# Patient Record
Sex: Male | Born: 1980
Health system: Southern US, Community
[De-identification: ages and names within clinical notes are randomized; demographics above are authoritative.]

## PROBLEM LIST (undated history)

## (undated) DIAGNOSIS — F419 Anxiety disorder, unspecified: Secondary | ICD-10-CM

## (undated) DIAGNOSIS — J309 Allergic rhinitis, unspecified: Secondary | ICD-10-CM

## (undated) DIAGNOSIS — M1990 Unspecified osteoarthritis, unspecified site: Secondary | ICD-10-CM

## (undated) DIAGNOSIS — Z87442 Personal history of urinary calculi: Secondary | ICD-10-CM

## (undated) DIAGNOSIS — R51 Headache: Secondary | ICD-10-CM

## (undated) DIAGNOSIS — F1021 Alcohol dependence, in remission: Secondary | ICD-10-CM

## (undated) DIAGNOSIS — E669 Obesity, unspecified: Secondary | ICD-10-CM

## (undated) HISTORY — DX: Alcohol dependence, in remission: F10.21

## (undated) HISTORY — DX: Headache: R51

## (undated) HISTORY — DX: Allergic rhinitis, unspecified: J30.9

## (undated) HISTORY — PX: TONSILLECTOMY: SUR1361

## (undated) HISTORY — DX: Obesity, unspecified: E66.9

## (undated) HISTORY — DX: Personal history of urinary calculi: Z87.442

## (undated) HISTORY — DX: Anxiety disorder, unspecified: F41.9

---
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
## 2002-04-11 ENCOUNTER — Encounter: Admission: RE | Admit: 2002-04-11 | Discharge: 2002-04-11 | Payer: Self-pay | Admitting: Sports Medicine

## 2002-04-11 ENCOUNTER — Encounter: Payer: Self-pay | Admitting: Sports Medicine

## 2003-03-09 ENCOUNTER — Encounter: Admission: RE | Admit: 2003-03-09 | Discharge: 2003-03-09 | Payer: Self-pay | Admitting: Family Medicine

## 2003-04-09 ENCOUNTER — Encounter: Admission: RE | Admit: 2003-04-09 | Discharge: 2003-04-09 | Payer: Self-pay | Admitting: Family Medicine

## 2003-04-30 ENCOUNTER — Encounter: Admission: RE | Admit: 2003-04-30 | Discharge: 2003-04-30 | Payer: Self-pay | Admitting: Sports Medicine

## 2003-07-17 ENCOUNTER — Encounter: Admission: RE | Admit: 2003-07-17 | Discharge: 2003-07-17 | Payer: Self-pay | Admitting: Sports Medicine

## 2003-08-29 ENCOUNTER — Emergency Department (HOSPITAL_COMMUNITY): Admission: EM | Admit: 2003-08-29 | Discharge: 2003-08-29 | Payer: Self-pay | Admitting: *Deleted

## 2004-11-29 ENCOUNTER — Ambulatory Visit: Payer: Self-pay | Admitting: Family Medicine

## 2005-03-20 ENCOUNTER — Ambulatory Visit: Payer: Self-pay | Admitting: Family Medicine

## 2006-01-15 ENCOUNTER — Ambulatory Visit: Payer: Self-pay | Admitting: Family Medicine

## 2006-01-26 ENCOUNTER — Ambulatory Visit: Payer: Self-pay | Admitting: Family Medicine

## 2006-06-30 ENCOUNTER — Emergency Department (HOSPITAL_COMMUNITY): Admission: EM | Admit: 2006-06-30 | Discharge: 2006-07-01 | Payer: Self-pay | Admitting: Emergency Medicine

## 2006-07-04 ENCOUNTER — Encounter: Payer: Self-pay | Admitting: Family Medicine

## 2006-07-04 ENCOUNTER — Ambulatory Visit: Payer: Self-pay | Admitting: Sports Medicine

## 2006-07-04 DIAGNOSIS — N200 Calculus of kidney: Secondary | ICD-10-CM

## 2006-07-10 ENCOUNTER — Encounter: Payer: Self-pay | Admitting: Family Medicine

## 2006-07-10 ENCOUNTER — Telehealth: Payer: Self-pay | Admitting: Family Medicine

## 2006-07-10 DIAGNOSIS — Z87442 Personal history of urinary calculi: Secondary | ICD-10-CM | POA: Insufficient documentation

## 2006-07-10 HISTORY — DX: Personal history of urinary calculi: Z87.442

## 2010-08-16 ENCOUNTER — Observation Stay (HOSPITAL_COMMUNITY)
Admission: EM | Admit: 2010-08-16 | Discharge: 2010-08-17 | Disposition: A | Discharge status: Home / Self Care | Payer: Self-pay | Attending: Emergency Medicine | Admitting: Emergency Medicine

## 2010-08-16 DIAGNOSIS — M7989 Other specified soft tissue disorders: Secondary | ICD-10-CM | POA: Insufficient documentation

## 2010-08-16 DIAGNOSIS — L03119 Cellulitis of unspecified part of limb: Secondary | ICD-10-CM | POA: Insufficient documentation

## 2010-08-16 DIAGNOSIS — L02519 Cutaneous abscess of unspecified hand: Principal | ICD-10-CM | POA: Insufficient documentation

## 2010-08-16 LAB — POCT I-STAT, CHEM 8
Calcium, Ion: 1.1 mmol/L — ABNORMAL LOW (ref 1.12–1.32)
Chloride: 105 mEq/L (ref 96–112)
Creatinine, Ser: 0.9 mg/dL (ref 0.50–1.35)
Glucose, Bld: 92 mg/dL (ref 70–99)
HCT: 41 % (ref 39.0–52.0)
Potassium: 3.2 mEq/L — ABNORMAL LOW (ref 3.5–5.1)
Sodium: 143 mEq/L (ref 135–145)
TCO2: 26 mmol/L (ref 0–100)

## 2010-08-16 LAB — CBC
Platelets: 247 10*3/uL (ref 150–400)
RDW: 13.7 % (ref 11.5–15.5)
WBC: 10.9 10*3/uL — ABNORMAL HIGH (ref 4.0–10.5)

## 2010-08-16 LAB — DIFFERENTIAL
Basophils Absolute: 0.1 10*3/uL (ref 0.0–0.1)
Eosinophils Absolute: 0.3 10*3/uL (ref 0.0–0.7)
Eosinophils Relative: 2 % (ref 0–5)
Lymphocytes Relative: 28 % (ref 12–46)
Lymphs Abs: 3.1 10*3/uL (ref 0.7–4.0)
Monocytes Absolute: 0.8 10*3/uL (ref 0.1–1.0)
Neutrophils Relative %: 61 % (ref 43–77)

## 2012-01-29 ENCOUNTER — Ambulatory Visit (INDEPENDENT_AMBULATORY_CARE_PROVIDER_SITE_OTHER): Payer: Self-pay | Admitting: Family Medicine

## 2012-01-29 ENCOUNTER — Encounter: Payer: Self-pay | Admitting: Family Medicine

## 2012-01-29 VITALS — BP 131/85 | HR 85 | Temp 98.6°F | Ht 70.5 in | Wt 254.0 lb

## 2012-01-29 DIAGNOSIS — R51 Headache: Secondary | ICD-10-CM

## 2012-01-29 DIAGNOSIS — F419 Anxiety disorder, unspecified: Secondary | ICD-10-CM

## 2012-01-29 DIAGNOSIS — R197 Diarrhea, unspecified: Secondary | ICD-10-CM

## 2012-01-29 DIAGNOSIS — F411 Generalized anxiety disorder: Secondary | ICD-10-CM

## 2012-01-29 HISTORY — DX: Anxiety disorder, unspecified: F41.9

## 2012-01-29 LAB — COMPREHENSIVE METABOLIC PANEL
AST: 20 U/L (ref 0–37)
Albumin: 4.2 g/dL (ref 3.5–5.2)
Alkaline Phosphatase: 102 U/L (ref 39–117)
Potassium: 3.8 mEq/L (ref 3.5–5.3)
Sodium: 141 mEq/L (ref 135–145)
Total Protein: 7.6 g/dL (ref 6.0–8.3)

## 2012-01-29 LAB — CBC WITH DIFFERENTIAL/PLATELET
Basophils Relative: 1 % (ref 0–1)
Eosinophils Absolute: 0.1 10*3/uL (ref 0.0–0.7)
MCH: 26.7 pg (ref 26.0–34.0)
MCHC: 34 g/dL (ref 30.0–36.0)
Monocytes Relative: 8 % (ref 3–12)
Neutrophils Relative %: 46 % (ref 43–77)
Platelets: 271 10*3/uL (ref 150–400)

## 2012-01-29 LAB — TSH: TSH: 1.719 u[IU]/mL (ref 0.350–4.500)

## 2012-01-29 LAB — POCT SEDIMENTATION RATE: POCT SED RATE: 39 mm/hr — AB (ref 0–22)

## 2012-01-29 NOTE — Patient Instructions (Signed)
Please Call Dr Spero Geralds , Psy. D., to set up a consultation.  This Consultation will help [place] develop a plan to help you with your unpleasant experiences.   You will receive the results of your lab results within 2 weeks. If you do not receive results; please call.

## 2012-01-30 ENCOUNTER — Encounter: Payer: Self-pay | Admitting: Family Medicine

## 2012-01-30 DIAGNOSIS — R51 Headache: Secondary | ICD-10-CM | POA: Insufficient documentation

## 2012-01-30 DIAGNOSIS — R519 Headache, unspecified: Secondary | ICD-10-CM | POA: Insufficient documentation

## 2012-01-30 HISTORY — DX: Headache: R51

## 2012-01-30 NOTE — Assessment & Plan Note (Addendum)
Will explore more on next office visits. CMP     Component Value Date/Time   NA 141 01/29/2012 1537   K 3.8 01/29/2012 1537   CL 105 01/29/2012 1537   CO2 24 01/29/2012 1537   GLUCOSE 82 01/29/2012 1537   BUN 12 01/29/2012 1537   CREATININE 1.06 01/29/2012 1537   CREATININE 0.90 08/16/2010 2255   CALCIUM 9.0 01/29/2012 1537   PROT 7.6 01/29/2012 1537   ALBUMIN 4.2 01/29/2012 1537   AST 20 01/29/2012 1537   ALT 22 01/29/2012 1537   ALKPHOS 102 01/29/2012 1537   BILITOT 0.2* 01/29/2012 1537   Lab Results  Component Value Date   POCTSEDRATE 39* 01/29/2012    Elevated ESR

## 2012-01-30 NOTE — Assessment & Plan Note (Signed)
Pt is presenting with Mood disorder symptoms.   He acknowledges depressive symptoms of prolonged sadness and decrease pleasure in life.    May have neuro-vegative (decreased sleep and weight gain) and somatic component (headache and diarrhea).   Also with Anxiety complaints resemble panic attacks.  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  Not yet explored impact on his daily function.   Pt has two first-degree relatives with Bipolar Disorder, so I am hesitant to start pt on SSRI immediately. Pt is willing to contact and consult with Dr Pascal Lux for assistance with diagnosis and treatment. Pt to return in 2 weeks for more in depth evaluation of Mood Disorder, with MDS, PHQ9, and Prime MD.

## 2012-01-30 NOTE — Progress Notes (Signed)
Subjective:    Patient ID: Benjamin Stone, male    DOB: 1980-04-27, 31 y.o.   MRN: [id number]  HPI  Initial Consultation to establish medical at our practice.  Pt's dgt is patient at [facility].  Pt's mother, Benjamin Stone is also a patient of our practice and a former employee of our practice.  Pt recently moved back to [place] from [place].   Concerns include persistent loose bowel movements, 4 to 5 times a day, of a "loose mud consistency".  Onset was after he returned from [employer] tour of duty in [place] in 2004.  Chest pains that are brief, lasting seconds), sharp, that are severe enough to make him want to sit down. Began a couple months ago. No associated symptoms such as N/V, shortness of breath, palpitations.   Headaches that are almost daily, can wake him up, not relieved by acetaminophen or Aleve. Last often 2 days at a time.    Sleeping only 4 hours a night, deneis grogginess or fatigue during day.  Chief complaint listed on EMR for visit was "PTSD".   Pt does not have a formal diagnosis for PTSD.  He is filing for disability compensation from the [employer].   Saddness with irritability that disturbes him.  He relates getting upset easily with others, including his young daughter.  He separates himself from situations when he becomes upset.  He reports feeling like he can't get comfortable, feeling "nervous" and "sweaty" in large social or confined spaces.  He reports feeling "on edge" constantly.  He has episodes of anxiety come over him both in social or confined situations, but can come over him while "just watching TV."  He reports vivid dreams of events from [place] a couple times a week.   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  His best friend died there.  He drank heavily in [place].  He was able to procure alcohol in [place] for himself and fellow soldiers through [employer].  He drank heavily when returning to the [place] for couple  years, quitting several years ago after the birth of his dgt.  He denies legal consequences either in [employer] or civilian life as the results of his alcohol use.  He has episodes where he spends much more money than he intended with an intended $50 purchase changing to a $500 one by the end of the shopping event.  Denies high-risk sexual or potentially physically harmful behaviors.   He reports difficulty with "time dilation" where he will have difficulty remembering when events have happened to him.  Used to enjoy working out in gymnasium, but though he has enrolled in [employer], he very rarely goes.   He denies thoughts of self-harm.   Family history significant with Bipolar Disorder in both his mother and sister.     Review of Systems  Constitutional: Negative for fever and unexpected weight change.  HENT: Positive for hearing loss. Negative for sore throat, mouth sores, trouble swallowing and tinnitus.   Eyes: Negative for visual disturbance.  Respiratory: Negative for cough and shortness of breath.   Cardiovascular: Negative for chest pain, palpitations and leg swelling.  Gastrointestinal: Positive for diarrhea. Negative for nausea, vomiting, abdominal pain, constipation and blood in stool.  Genitourinary: Negative for dysuria, frequency, hematuria, discharge, enuresis and difficulty urinating.  Musculoskeletal: Negative for joint swelling and arthralgias.  Neurological: Negative for dizziness, syncope, weakness and headaches.  Psychiatric/Behavioral: Positive for dysphoric mood and agitation. Negative for  suicidal ideas. The patient is nervous/anxious.        Objective:   Physical Exam  Vitals reviewed. Cardiovascular: Normal rate and regular rhythm.   Pulmonary/Chest: Effort normal and breath sounds normal.  Psychiatric: Thought content normal. His speech is not rapid and/or pressured, not tangential and not slurred. He is not agitated, is not hyperactive and not slowed.  Cognition and memory are normal. He does not express impulsivity or inappropriate judgment. He expresses no suicidal ideation. He expresses no suicidal plans. He exhibits normal recent memory and normal remote memory.       Groomed, good eye contact. Brought 31 year old dgt to visit. Dgt stayed with Nurses for interview.   Not anxious appearing during interview.  He is attentive.          Assessment & Plan:

## 2012-02-09 ENCOUNTER — Telehealth: Payer: Self-pay | Admitting: Psychology

## 2012-02-09 NOTE — Telephone Encounter (Signed)
Patient called to schedule an appointment.  Reviewed Dr. McDiarmid's note.  It looks like a referral to [facility].  First available was February 5th at 9:00.  He does not have health insurance so could not look elsewhere to get him in sooner unless we sent to [facility].  Thought it best to have him come here.  Encouraged him to follow-up with Dr. McDiarmid in the meantime.

## 2012-03-13 ENCOUNTER — Ambulatory Visit: Payer: Self-pay | Admitting: Psychology

## 2012-03-13 NOTE — Telephone Encounter (Signed)
Did not show up for initial [facility] appointment.  Per our policy, will not be able to schedule him in [facility].  He can be referred to [facility] if he is interested in treatment.

## 2013-02-06 HISTORY — PX: TOOTH EXTRACTION: SUR596

## 2014-03-17 ENCOUNTER — Emergency Department (HOSPITAL_BASED_OUTPATIENT_CLINIC_OR_DEPARTMENT_OTHER)
Admission: EM | Admit: 2014-03-17 | Discharge: 2014-03-17 | Disposition: A | Discharge status: Home / Self Care | Payer: PRIVATE HEALTH INSURANCE | Attending: Emergency Medicine | Admitting: Emergency Medicine

## 2014-03-17 ENCOUNTER — Encounter (HOSPITAL_BASED_OUTPATIENT_CLINIC_OR_DEPARTMENT_OTHER): Payer: Self-pay

## 2014-03-17 DIAGNOSIS — K0381 Cracked tooth: Secondary | ICD-10-CM | POA: Insufficient documentation

## 2014-03-17 DIAGNOSIS — K088 Other specified disorders of teeth and supporting structures: Secondary | ICD-10-CM | POA: Insufficient documentation

## 2014-03-17 DIAGNOSIS — Z791 Long term (current) use of non-steroidal anti-inflammatories (NSAID): Secondary | ICD-10-CM | POA: Insufficient documentation

## 2014-03-17 DIAGNOSIS — Z87891 Personal history of nicotine dependence: Secondary | ICD-10-CM | POA: Insufficient documentation

## 2014-03-17 DIAGNOSIS — K0889 Other specified disorders of teeth and supporting structures: Secondary | ICD-10-CM

## 2014-03-17 MED ORDER — AMOXICILLIN 500 MG PO CAPS: 500.0000 mg | ORAL_CAPSULE | Freq: Three times a day (TID) | ORAL | Status: AC

## 2014-03-17 MED ORDER — HYDROCODONE-ACETAMINOPHEN 5-325 MG PO TABS: 2.0000 | ORAL_TABLET | ORAL | Status: DC | PRN

## 2014-03-17 NOTE — ED Notes (Signed)
Right upper toothache x 2 weeks

## 2014-03-17 NOTE — ED Provider Notes (Signed)
CSN: [id number]     Arrival date & time 03/17/14  2102 History   First MD Initiated Contact with Patient 03/17/14 2120     Chief Complaint  Patient presents with  . Dental Pain     (Consider location/radiation/quality/duration/timing/severity/associated sxs/prior Treatment) Patient is a 34 y.o. male presenting with tooth pain. The history is provided by the patient. No language interpreter was used.  Dental Pain Location:  Upper Upper teeth location:  2/RU 2nd molar Quality:  Aching Severity:  Moderate Onset quality:  Gradual Duration:  2 weeks Timing:  Constant Progression:  Worsening Chronicity:  New Context: not abscess   Relieved by:  Nothing Worsened by:  Nothing tried Ineffective treatments:  None tried Associated symptoms: no facial swelling     Past Medical History  Diagnosis Date  . Allergic rhinitis   . Alcohol dependence in remission     Abstinence starting 2011   History reviewed. No pertinent past surgical history. Family History  Problem Relation Age of Onset  . Bipolar disorder Mother   . Bipolar disorder Sister   . Psoriasis Mother   . Diabetes Mellitus II Mother    History  Substance Use Topics  . Smoking status: Former Smoker    Quit date: 01/29/2005  . Smokeless tobacco: Not on file  . Alcohol Use: No    Review of Systems  HENT: Negative for facial swelling.   All other systems reviewed and are negative.     Allergies  Review of patient's allergies indicates no known allergies.  Home Medications   Prior to Admission medications   Medication Sig Start Date End Date Taking? Authorizing Provider  naproxen sodium (ANAPROX) 220 MG tablet Take 220 mg by mouth 2 (two) times daily with a meal.   Yes Historical Provider, MD  amoxicillin (AMOXIL) 500 MG capsule Take 1 capsule (500 mg total) by mouth 3 (three) times daily. 03/17/14 03/27/14  Elson AreasLeslie K Sofia, PA-C  HYDROcodone-acetaminophen (NORCO/VICODIN) 5-325 MG per tablet Take 2 tablets by mouth  every 4 (four) hours as needed. 03/17/14   Elson AreasLeslie K Sofia, PA-C   BP 139/88 mmHg  Pulse 68  Temp(Src) 98.2 F (36.8 C) (Oral)  Resp 18  Ht 5\' 10"  (1.778 m)  Wt 270 lb (122.471 kg)  BMI 38.74 kg/m2  SpO2 100% Physical Exam  Constitutional: He is oriented to person, place, and time. He appears well-developed and well-nourished.  HENT:  Head: Normocephalic.  Broken right upper 2nd molar  Eyes: EOM are normal.  Neck: Normal range of motion.  Pulmonary/Chest: Effort normal.  Abdominal: He exhibits no distension.  Musculoskeletal: Normal range of motion.  Neurological: He is alert and oriented to person, place, and time.  Psychiatric: He has a normal mood and affect.  Nursing note and vitals reviewed.   ED Course  Procedures (including critical care time) Labs Review Labs Reviewed - No data to display  Imaging Review No results found.   EKG Interpretation None      MDM   Final diagnoses:  Toothache    See your dentist as scheuled amoxicillian hydrocodone    Elson AreasLeslie K Sofia, PA-C 03/17/14 2137  Toy BakerAnthony T Allen, MD 03/20/14 [phone]

## 2014-03-17 NOTE — Discharge Instructions (Signed)

## 2014-06-09 ENCOUNTER — Encounter (HOSPITAL_BASED_OUTPATIENT_CLINIC_OR_DEPARTMENT_OTHER): Payer: Self-pay | Admitting: Emergency Medicine

## 2014-06-09 ENCOUNTER — Emergency Department (HOSPITAL_BASED_OUTPATIENT_CLINIC_OR_DEPARTMENT_OTHER)
Admission: EM | Admit: 2014-06-09 | Discharge: 2014-06-09 | Disposition: A | Discharge status: Home / Self Care | Payer: PRIVATE HEALTH INSURANCE | Attending: Emergency Medicine | Admitting: Emergency Medicine

## 2014-06-09 DIAGNOSIS — Z87891 Personal history of nicotine dependence: Secondary | ICD-10-CM | POA: Diagnosis not present

## 2014-06-09 DIAGNOSIS — K047 Periapical abscess without sinus: Secondary | ICD-10-CM | POA: Diagnosis not present

## 2014-06-09 DIAGNOSIS — K088 Other specified disorders of teeth and supporting structures: Secondary | ICD-10-CM | POA: Diagnosis present

## 2014-06-09 DIAGNOSIS — Z791 Long term (current) use of non-steroidal anti-inflammatories (NSAID): Secondary | ICD-10-CM | POA: Diagnosis not present

## 2014-06-09 MED ORDER — PENICILLIN V POTASSIUM 500 MG PO TABS: 500.0000 mg | ORAL_TABLET | Freq: Four times a day (QID) | ORAL | Status: AC

## 2014-06-09 MED ORDER — IBUPROFEN 800 MG PO TABS: 800.0000 mg | ORAL_TABLET | Freq: Three times a day (TID) | ORAL | Status: DC | PRN

## 2014-06-09 NOTE — Discharge Instructions (Signed)

## 2014-06-09 NOTE — ED Notes (Signed)
34 yo with dental pain right lower jaw. Reports using warm salt water rinse with no relief. Rates pain 8/10

## 2014-06-09 NOTE — ED Provider Notes (Signed)
CSN: [id number]     Arrival date & time 06/09/14  1004 History   First MD Initiated Contact with Patient 06/09/14 1123     Chief Complaint  Patient presents with  . Dental Pain     (Consider location/radiation/quality/duration/timing/severity/associated sxs/prior Treatment) Patient is a 34 y.o. male presenting with tooth pain.  Dental Pain Location:  Lower Lower teeth location:  19/LL 1st molar Quality:  Dull and aching Severity:  Severe Onset quality:  Gradual Duration:  4 days Timing:  Constant Progression:  Worsening Chronicity:  New Context: not dental fracture   Context comment:  Filling placed in this tooth years ago Previous work-up:  Filled cavity Relieved by: ibuprofen. Worsened by:  Pressure and touching Associated symptoms: facial pain and facial swelling   Associated symptoms: no fever, no oral lesions and no trismus     Past Medical History  Diagnosis Date  . Allergic rhinitis   . Alcohol dependence in remission     Abstinence starting 2011   Past Surgical History  Procedure Laterality Date  . Tonsillectomy    . Tooth extraction  2015    Feb 17th    Family History  Problem Relation Age of Onset  . Bipolar disorder Mother   . Bipolar disorder Sister   . Psoriasis Mother   . Diabetes Mellitus II Mother    History  Substance Use Topics  . Smoking status: Former Smoker    Quit date: 01/29/2005  . Smokeless tobacco: Not on file  . Alcohol Use: No    Review of Systems  Constitutional: Negative for fever.  HENT: Positive for facial swelling. Negative for mouth sores.   All other systems reviewed and are negative.     Allergies  Review of patient's allergies indicates no known allergies.  Home Medications   Prior to Admission medications   Medication Sig Start Date End Date Taking? Authorizing Provider  HYDROcodone-acetaminophen (NORCO/VICODIN) 5-325 MG per tablet Take 2 tablets by mouth every 4 (four) hours as needed. 03/17/14   Elson AreasLeslie K  Sofia, PA-C  naproxen sodium (ANAPROX) 220 MG tablet Take 220 mg by mouth 2 (two) times daily with a meal.    Historical Provider, MD   BP 129/85 mmHg  Pulse 70  Temp(Src) 98.2 F (36.8 C) (Oral)  Resp 18  Ht 6' (1.829 m)  Wt 262 lb (118.842 kg)  BMI 35.53 kg/m2  SpO2 100% Physical Exam  Constitutional: He is oriented to person, place, and time. He appears well-developed and well-nourished. No distress.  HENT:  Head: Normocephalic and atraumatic.  Mouth/Throat:    Eyes: Conjunctivae are normal. No scleral icterus.  Neck: Neck supple.  Cardiovascular: Normal rate and intact distal pulses.   Pulmonary/Chest: Effort normal. No stridor. No respiratory distress.  Abdominal: Normal appearance. He exhibits no distension.  Neurological: He is alert and oriented to person, place, and time.  Skin: Skin is warm and dry. No rash noted.  Psychiatric: He has a normal mood and affect. His behavior is normal.  Nursing note and vitals reviewed.   ED Course  Procedures (including critical care time) Labs Review Labs Reviewed - No data to display  Imaging Review No results found.   EKG Interpretation None      MDM   Final diagnoses:  Dental abscess    Dental pain without apparent complication.  PCN and he already has dental appointment next week.      Blake DivineJohn Alaysha Jefcoat, MD 06/09/14 [phone]

## 2015-05-03 ENCOUNTER — Encounter: Payer: Self-pay | Admitting: Internal Medicine

## 2015-05-03 ENCOUNTER — Ambulatory Visit (INDEPENDENT_AMBULATORY_CARE_PROVIDER_SITE_OTHER): Payer: PRIVATE HEALTH INSURANCE | Admitting: Internal Medicine

## 2015-05-03 VITALS — Temp 98.1°F | Wt 246.1 lb

## 2015-05-03 DIAGNOSIS — J029 Acute pharyngitis, unspecified: Secondary | ICD-10-CM | POA: Insufficient documentation

## 2015-05-03 NOTE — Progress Notes (Signed)
   Subjective:    Patient ID: Benjamin Stone, male    DOB: 1980/06/18, 35 y.o.   MRN: [id number]  HPI  Patient presents for same day appointment for sore throat.   Sore Throat Patient reports sore throat beginning a day and a half ago. He reports that initially his throat felt scratchy, and then became more painful as the night went on. He has been drinking orange juice and using cough drops with brief symptomatic relief. He denies fever, body aches, sneezing, itching or red eyes, coughing. Endorses minimal nasal congestion. Denies sick contacts. Reports that he was in [place] last week and there was a lot of pollen, so he thinks his symptoms may be due to allergies.   Review of Systems See HPI.     Objective:   Physical Exam  Constitutional: He is oriented to person, place, and time. He appears well-developed and well-nourished. No distress.  HENT:  Head: Normocephalic and atraumatic.  Mild oropharyngeal erythema. No exudates. S/p tonsillectomy. No cervical lymphadenopathy or tenderness.  Eyes: Conjunctivae and EOM are normal. Right eye exhibits no discharge. Left eye exhibits no discharge.  Neck: Neck supple.  Lymphadenopathy:    He has no cervical adenopathy.  Neurological: He is alert and oriented to person, place, and time.  Psychiatric: He has a normal mood and affect. His behavior is normal.      Assessment & Plan:  Sore throat Centor score 1 (no fever, no tonsillar exudates, no lymphadenopathy, no cough). Given low Centor score, strep swab not indicated today. Likely 2/2 seasonal allergies rather than virus, especially given reported pollen exposure in days preceding symptoms.  - Resume Claritin  - Can continue cough drops and/or honey for symptomatic relief    Tarri AbernethyAbigail J Tyrece Vanterpool, MD PGY-1 [facility] Pager [phone]

## 2015-05-03 NOTE — Assessment & Plan Note (Signed)
Centor score 1 (no fever, no tonsillar exudates, no lymphadenopathy, no cough). Given low Centor score, strep swab not indicated today. Likely 2/2 seasonal allergies rather than virus, especially given reported pollen exposure in days preceding symptoms.  - Resume Claritin  - Can continue cough drops and/or honey for symptomatic relief

## 2015-05-03 NOTE — Patient Instructions (Addendum)
It was nice meeting you today Mr. Benjamin Stone!  For your sore throat, you can continue to use the throat lozenges that you have been using. You can also have a spoonful of honey, either alone or in a warm beverage, a few times a day, especially before bed. Using your regular Claritin will likely help as well.   If you have any questions or concerns, please feel free to call the office at any time.   Be well,  Dr. Natale MilchLancaster  Sore Throat A sore throat is a painful, burning, sore, or scratchy feeling of the throat. There may be pain or tenderness when swallowing or talking. You may have other symptoms with a sore throat. These include coughing, sneezing, fever, or a swollen neck. A sore throat is often the first sign of another sickness. These sicknesses may include a cold, flu, strep throat, or an infection called mono. Most sore throats go away without medical treatment.  HOME CARE   Only take medicine as told by your doctor.  Drink enough fluids to keep your pee (urine) clear or pale yellow.  Rest as needed.  Try using throat sprays, lozenges, or suck on hard candy (if older than 4 years or as told).  Sip warm liquids, such as broth, herbal tea, or warm water with honey. Try sucking on frozen ice pops or drinking cold liquids.  Rinse the mouth (gargle) with salt water. Mix 1 teaspoon salt with 8 ounces of water.  Do not smoke. Avoid being around others when they are smoking.  Put a humidifier in your bedroom at night to moisten the air. You can also turn on a hot shower and sit in the bathroom for 5-10 minutes. Be sure the bathroom door is closed. GET HELP RIGHT AWAY IF:   You have trouble breathing.  You cannot swallow fluids, soft foods, or your spit (saliva).  You have more puffiness (swelling) in the throat.  Your sore throat does not get better in 7 days.  You feel sick to your stomach (nauseous) and throw up (vomit).  You have a fever or lasting symptoms for more than 2-3  days.  You have a fever and your symptoms suddenly get worse. MAKE SURE YOU:   Understand these instructions.  Will watch your condition.  Will get help right away if you are not doing well or get worse.   This information is not intended to replace advice given to you by your health care provider. Make sure you discuss any questions you have with your health care provider.   Document Released: 11/02/2007 Document Revised: 10/18/2011 Document Reviewed: 10/01/2011 Elsevier Interactive Patient Education [employer]2016 Elsevier Inc.

## 2015-06-03 ENCOUNTER — Encounter (HOSPITAL_BASED_OUTPATIENT_CLINIC_OR_DEPARTMENT_OTHER): Payer: Self-pay | Admitting: Emergency Medicine

## 2015-06-03 ENCOUNTER — Emergency Department (HOSPITAL_BASED_OUTPATIENT_CLINIC_OR_DEPARTMENT_OTHER)
Admission: EM | Admit: 2015-06-03 | Discharge: 2015-06-03 | Disposition: A | Discharge status: Home / Self Care | Payer: 59 | Attending: Emergency Medicine | Admitting: Emergency Medicine

## 2015-06-03 DIAGNOSIS — R1032 Left lower quadrant pain: Secondary | ICD-10-CM | POA: Insufficient documentation

## 2015-06-03 DIAGNOSIS — Z87891 Personal history of nicotine dependence: Secondary | ICD-10-CM | POA: Insufficient documentation

## 2015-06-03 LAB — COMPREHENSIVE METABOLIC PANEL
ALBUMIN: 3.8 g/dL (ref 3.5–5.0)
ALK PHOS: 100 U/L (ref 38–126)
ALT: 29 U/L (ref 17–63)
AST: 26 U/L (ref 15–41)
Anion gap: <3 — ABNORMAL LOW (ref 5–15)
BILIRUBIN TOTAL: 0.6 mg/dL (ref 0.3–1.2)
BUN: 13 mg/dL (ref 6–20)
CALCIUM: 8.6 mg/dL — AB (ref 8.9–10.3)
CO2: 28 mmol/L (ref 22–32)
CREATININE: 1.09 mg/dL (ref 0.61–1.24)
Chloride: 107 mmol/L (ref 101–111)
GFR calc Af Amer: >60 mL/min (ref >60)
GFR calc non Af Amer: >60 mL/min (ref >60)
Glucose, Bld: 102 mg/dL — ABNORMAL HIGH (ref 65–99)
Potassium: 4.1 mmol/L (ref 3.5–5.1)
Sodium: 137 mmol/L (ref 135–145)
Total Protein: 7.9 g/dL (ref 6.5–8.1)

## 2015-06-03 LAB — URINALYSIS, ROUTINE W REFLEX MICROSCOPIC
BILIRUBIN URINE: NEGATIVE
Glucose, UA: NEGATIVE mg/dL
HGB URINE DIPSTICK: NEGATIVE
Ketones, ur: NEGATIVE mg/dL
Leukocytes, UA: NEGATIVE
Nitrite: NEGATIVE
PROTEIN: NEGATIVE mg/dL
Specific Gravity, Urine: 1.023 (ref 1.005–1.030)
pH: 6 (ref 5.0–8.0)

## 2015-06-03 LAB — CBC
HCT: 38.1 % — ABNORMAL LOW (ref 39.0–52.0)
Hemoglobin: 12.6 g/dL — ABNORMAL LOW (ref 13.0–17.0)
MCH: 26.8 pg (ref 26.0–34.0)
MCHC: 33.1 g/dL (ref 30.0–36.0)
MCV: 80.9 fL (ref 78.0–100.0)
PLATELETS: 220 10*3/uL (ref 150–400)
RBC: 4.71 MIL/uL (ref 4.22–5.81)
RDW: 14.7 % (ref 11.5–15.5)
WBC: 6.6 10*3/uL (ref 4.0–10.5)

## 2015-06-03 LAB — LIPASE, BLOOD: Lipase: 19 U/L (ref 11–51)

## 2015-06-03 MED ORDER — ACETAMINOPHEN 500 MG PO TABS
1000.0000 mg | ORAL_TABLET | Freq: Once | ORAL | Status: AC
  Administered 2015-06-03: 1000 mg via ORAL
  Filled 2015-06-03: qty 2

## 2015-06-03 MED ORDER — ACETAMINOPHEN 500 MG PO TABS: 1000.0000 mg | ORAL_TABLET | Freq: Four times a day (QID) | ORAL | Status: DC | PRN

## 2015-06-03 MED FILL — MAPAP 500 MG CAPLET: 500 | 13 days supply | Qty: 100 | Fill #0

## 2015-06-03 NOTE — ED Notes (Signed)
Pt reports llq abdominal pain with sudden onset yesterday after noon after lunch, describes pain as a cramp, pt states pain decreased yesterday evening but then reoccured this am, with increased intensity of pain, no radiation, no n/v/d

## 2015-06-03 NOTE — ED Provider Notes (Signed)
CSN: [id number]     Arrival date & time 06/03/15  1022 History   First MD Initiated Contact with Patient 06/03/15 1036     Chief Complaint  Patient presents with  . Abdominal Pain   HPI   Benjamin Stone is an 35 y.o. male who presents to the ED for evaluation of abdominal pain. He states the pain started around 3 PM  Yesterday afternoon. He states the pain is in his LLQ with no radiation. He states it feels like a muscle cramp. States it is worse with movement. He states he tried hydrating with water and gatorade, took an aleve this morning with no relief. He denies N/V/D, fever, chills, urinary symptoms. He states it feels like a muscle cramp but denies new injury or trauma. He does endorse bowling twice weekly, last time was 2 nights ago, but does not remember injuring himself.   Past Medical History  Diagnosis Date  . Allergic rhinitis   . Alcohol dependence in remission (HCC)     Abstinence starting 2011   Past Surgical History  Procedure Laterality Date  . Tonsillectomy    . Tooth extraction  2015    Feb 17th    Family History  Problem Relation Age of Onset  . Bipolar disorder Mother   . Bipolar disorder Sister   . Psoriasis Mother   . Diabetes Mellitus II Mother    Social History  Substance Use Topics  . Smoking status: Former Smoker    Quit date: 01/29/2005  . Smokeless tobacco: None  . Alcohol Use: No    Review of Systems  All other systems reviewed and are negative.     Allergies  Review of patient's allergies indicates no known allergies.  Home Medications   Prior to Admission medications   Medication Sig Start Date End Date Taking? Authorizing Provider  HYDROcodone-acetaminophen (NORCO/VICODIN) 5-325 MG per tablet Take 2 tablets by mouth every 4 (four) hours as needed. 03/17/14   Elson Areas, PA-C  ibuprofen (ADVIL,MOTRIN) 800 MG tablet Take 1 tablet (800 mg total) by mouth every 8 (eight) hours as needed for moderate pain. 06/09/14   Blake Divine, MD   naproxen sodium (ANAPROX) 220 MG tablet Take 220 mg by mouth 2 (two) times daily with a meal.    Historical Provider, MD   BP 133/92 mmHg  Pulse 79  Temp(Src) 98.3 F (36.8 C) (Oral)  Resp 20  Wt 111.585 kg  SpO2 100% Physical Exam  Constitutional: He is oriented to person, place, and time.  HENT:  Right Ear: External ear normal.  Left Ear: External ear normal.  Nose: Nose normal.  Mouth/Throat: Oropharynx is clear and moist. No oropharyngeal exudate.  Eyes: Conjunctivae and EOM are normal. Pupils are equal, round, and reactive to light.  Neck: Normal range of motion. Neck supple.  Cardiovascular: Normal rate, regular rhythm, normal heart sounds and intact distal pulses.   Pulmonary/Chest: Effort normal and breath sounds normal. No respiratory distress. He has no wheezes. He exhibits no tenderness.  Abdominal: Soft. Bowel sounds are normal. He exhibits no distension. There is tenderness. There is no rebound and no guarding.  Mild diffuse LLQ tenderness but no guarding. No rebound. Abdomen soft. No CVA tenderness.   Musculoskeletal: He exhibits no edema.  Neurological: He is alert and oriented to person, place, and time. No cranial nerve deficit.  Skin: Skin is warm and dry.  Psychiatric: He has a normal mood and affect.  Nursing note and vitals reviewed.  ED Course  Procedures (including critical care time) Labs Review Labs Reviewed  COMPREHENSIVE METABOLIC PANEL - Abnormal; Notable for the following:    Glucose, Bld 102 (*)    Calcium 8.6 (*)    Anion gap <3 (*)    All other components within normal limits  CBC - Abnormal; Notable for the following:    Hemoglobin 12.6 (*)    HCT 38.1 (*)    All other components within normal limits  LIPASE, BLOOD  URINALYSIS, ROUTINE W REFLEX MICROSCOPIC (NOT AT [facility])    Imaging Review No results found. I have personally reviewed and evaluated these images and lab results as part of my medical decision-making.   EKG  Interpretation None      MDM   Final diagnoses:  Left lower quadrant pain    Pain is improved with tylenol. Labs negative and reassuring. On repeat exam abdomen remains non peritoneal and tenderness resolved. Pt is afebrile and well appearing. No indication for emergent imaging or further workup. Instructed to f/u with PCP. Tylenol rx given for pain . ER return precautions given.     Carlene CoriaSerena Y Andrya Roppolo, PA-C 06/04/15 1637  Alvira MondayErin Schlossman, MD 06/06/15 [phone]

## 2015-06-03 NOTE — Discharge Instructions (Signed)

## 2015-06-04 ENCOUNTER — Emergency Department (HOSPITAL_COMMUNITY)
Admission: EM | Admit: 2015-06-04 | Discharge: 2015-06-04 | Disposition: A | Discharge status: Home / Self Care | Payer: 59 | Attending: Emergency Medicine | Admitting: Emergency Medicine

## 2015-06-04 ENCOUNTER — Emergency Department (HOSPITAL_COMMUNITY): Payer: 59

## 2015-06-04 ENCOUNTER — Encounter (HOSPITAL_COMMUNITY): Payer: Self-pay | Admitting: *Deleted

## 2015-06-04 DIAGNOSIS — Z87891 Personal history of nicotine dependence: Secondary | ICD-10-CM | POA: Diagnosis not present

## 2015-06-04 DIAGNOSIS — Z791 Long term (current) use of non-steroidal anti-inflammatories (NSAID): Secondary | ICD-10-CM | POA: Insufficient documentation

## 2015-06-04 DIAGNOSIS — Z8709 Personal history of other diseases of the respiratory system: Secondary | ICD-10-CM | POA: Insufficient documentation

## 2015-06-04 DIAGNOSIS — R1032 Left lower quadrant pain: Secondary | ICD-10-CM | POA: Diagnosis not present

## 2015-06-04 DIAGNOSIS — R109 Unspecified abdominal pain: Secondary | ICD-10-CM | POA: Diagnosis present

## 2015-06-04 LAB — COMPREHENSIVE METABOLIC PANEL
ALT: 27 U/L (ref 17–63)
AST: 22 U/L (ref 15–41)
Albumin: 3.5 g/dL (ref 3.5–5.0)
Alkaline Phosphatase: 105 U/L (ref 38–126)
Anion gap: 10 (ref 5–15)
BUN: 11 mg/dL (ref 6–20)
CHLORIDE: 108 mmol/L (ref 101–111)
CO2: 24 mmol/L (ref 22–32)
CREATININE: 1.18 mg/dL (ref 0.61–1.24)
Calcium: 8.5 mg/dL — ABNORMAL LOW (ref 8.9–10.3)
GFR calc Af Amer: >60 mL/min (ref >60)
GFR calc non Af Amer: >60 mL/min (ref >60)
Glucose, Bld: 110 mg/dL — ABNORMAL HIGH (ref 65–99)
Potassium: 3.7 mmol/L (ref 3.5–5.1)
SODIUM: 142 mmol/L (ref 135–145)
Total Bilirubin: 0.3 mg/dL (ref 0.3–1.2)
Total Protein: 7.3 g/dL (ref 6.5–8.1)

## 2015-06-04 LAB — CBC WITH DIFFERENTIAL/PLATELET
BASOS ABS: 0.1 10*3/uL (ref 0.0–0.1)
Basophils Relative: 1 %
EOS ABS: 0.1 10*3/uL (ref 0.0–0.7)
EOS PCT: 1 %
HCT: 37.7 % — ABNORMAL LOW (ref 39.0–52.0)
Hemoglobin: 11.9 g/dL — ABNORMAL LOW (ref 13.0–17.0)
Lymphocytes Relative: 32 %
Lymphs Abs: 2.3 10*3/uL (ref 0.7–4.0)
MCH: 25.9 pg — ABNORMAL LOW (ref 26.0–34.0)
MCHC: 31.6 g/dL (ref 30.0–36.0)
MCV: 82 fL (ref 78.0–100.0)
Monocytes Absolute: 0.7 10*3/uL (ref 0.1–1.0)
Monocytes Relative: 10 %
Neutro Abs: 4 10*3/uL (ref 1.7–7.7)
Neutrophils Relative %: 56 %
PLATELETS: 218 10*3/uL (ref 150–400)
RBC: 4.6 MIL/uL (ref 4.22–5.81)
RDW: 14.8 % (ref 11.5–15.5)
WBC: 7.1 10*3/uL (ref 4.0–10.5)

## 2015-06-04 LAB — URINALYSIS, ROUTINE W REFLEX MICROSCOPIC
Bilirubin Urine: NEGATIVE
Glucose, UA: NEGATIVE mg/dL
HGB URINE DIPSTICK: NEGATIVE
Ketones, ur: NEGATIVE mg/dL
Leukocytes, UA: NEGATIVE
Nitrite: NEGATIVE
Protein, ur: NEGATIVE mg/dL
SPECIFIC GRAVITY, URINE: 1.022 (ref 1.005–1.030)
pH: 6 (ref 5.0–8.0)

## 2015-06-04 LAB — LIPASE, BLOOD: Lipase: 22 U/L (ref 11–51)

## 2015-06-04 MED ORDER — OXYCODONE-ACETAMINOPHEN 5-325 MG PO TABS: 1.0000 | ORAL_TABLET | ORAL | Status: DC | PRN

## 2015-06-04 MED ORDER — IOPAMIDOL (ISOVUE-300) INJECTION 61%
INTRAVENOUS | Status: AC
  Administered 2015-06-04: 100 mL via INTRAVENOUS
  Filled 2015-06-04: qty 100

## 2015-06-04 MED ORDER — OXYCODONE-ACETAMINOPHEN 5-325 MG PO TABS
ORAL_TABLET | ORAL | Status: AC
  Filled 2015-06-04: qty 1

## 2015-06-04 MED ORDER — ONDANSETRON 4 MG PO TBDP: 4.0000 mg | ORAL_TABLET | Freq: Three times a day (TID) | ORAL | Status: DC | PRN

## 2015-06-04 MED ORDER — MORPHINE SULFATE (PF) 4 MG/ML IV SOLN
4.0000 mg | Freq: Once | INTRAVENOUS | Status: AC
  Administered 2015-06-04: 4 mg via INTRAVENOUS
  Filled 2015-06-04: qty 1

## 2015-06-04 MED ORDER — OXYCODONE-ACETAMINOPHEN 5-325 MG PO TABS
1.0000 | ORAL_TABLET | ORAL | Status: DC | PRN
  Administered 2015-06-04: 1 via ORAL

## 2015-06-04 MED FILL — ONDANSETRON ODT 4 MG TABLET: 4 | 4 days supply | Qty: 10 | Fill #0

## 2015-06-04 MED FILL — OXYCODONE/APAP 5-325: 5-325 | 3 days supply | Qty: 15 | Fill #0

## 2015-06-04 NOTE — Discharge Instructions (Signed)
Take the prescribed medication as directed.  Use caution when taking percocet, it can make you sleepy.  Do not take excess tylenol with this medication, may take motrin with it instead. Follow-up with your primary care physician on Monday if you continue having symptoms through the weekend. Return to the ED for new or worsening symptoms.

## 2015-06-04 NOTE — ED Notes (Signed)
Pt to ED c/o LLQ pain onset 2 days. Was seen at [facility] yesterday, given prescription for Tylenol. Pain is more consistent and has worsened since discharge. Denies GI/GU issues

## 2015-06-04 NOTE — ED Provider Notes (Signed)
CSN: [id number]     Arrival date & time 06/04/15  0423 History   First MD Initiated Contact with Patient 06/04/15 [phone]     Chief Complaint  Patient presents with  . Abdominal Pain     (Consider location/radiation/quality/duration/timing/severity/associated sxs/prior Treatment) Patient is a 35 y.o. male presenting with abdominal pain. The history is provided by the patient and medical records.  Abdominal Pain   35 y.o. M with hx of alcohol abuse, presenting to the ED for abdominal pain.  Patient reports abdominal pain began on yesterday afternoon while he was at work but pain has worsened since this time.  He states he did take some Motrin which helped somewhat. He was [facility] yesterday afternoon when pain returned.  There he had negative labs and was discharged home with tylenol.  Patient states throughout the night pain has worsened. Her pain continues to be crampy in nature, but now has intermittent sharp, stabbing sensations. No radiation of pain. He denies any nausea, vomiting, or diarrhea. No urinary symptoms. No history of GI issues. No history of kidney stones. No prior abdominal surgeries. Vital signs stable on arrival.  Past Medical History  Diagnosis Date  . Allergic rhinitis   . Alcohol dependence in remission (HCC)     Abstinence starting 2011   Past Surgical History  Procedure Laterality Date  . Tonsillectomy    . Tooth extraction  2015    Feb 17th    Family History  Problem Relation Age of Onset  . Bipolar disorder Mother   . Bipolar disorder Sister   . Psoriasis Mother   . Diabetes Mellitus II Mother    Social History  Substance Use Topics  . Smoking status: Former Smoker    Quit date: 01/29/2005  . Smokeless tobacco: None  . Alcohol Use: No    Review of Systems  Gastrointestinal: Positive for abdominal pain.  All other systems reviewed and are negative.     Allergies  Review of patient's allergies indicates no known  allergies.  Home Medications   Prior to Admission medications   Medication Sig Start Date End Date Taking? Authorizing Provider  acetaminophen (TYLENOL) 500 MG tablet Take 2 tablets (1,000 mg total) by mouth every 6 (six) hours as needed. 06/03/15   Carlene CoriaSerena Y Sam, PA-C  HYDROcodone-acetaminophen (NORCO/VICODIN) 5-325 MG per tablet Take 2 tablets by mouth every 4 (four) hours as needed. 03/17/14   Elson AreasLeslie K Sofia, PA-C  ibuprofen (ADVIL,MOTRIN) 800 MG tablet Take 1 tablet (800 mg total) by mouth every 8 (eight) hours as needed for moderate pain. 06/09/14   Blake DivineJohn Wofford, MD  naproxen sodium (ANAPROX) 220 MG tablet Take 220 mg by mouth 2 (two) times daily with a meal.    Historical Provider, MD   BP 136/86 mmHg  Pulse 87  Temp(Src) 97.6 F (36.4 C) (Oral)  Resp 18  Wt 111.585 kg  SpO2 97%   Physical Exam  Constitutional: He is oriented to person, place, and time. He appears well-developed and well-nourished. No distress.  HENT:  Head: Normocephalic and atraumatic.  Mouth/Throat: Oropharynx is clear and moist.  Eyes: Conjunctivae and EOM are normal. Pupils are equal, round, and reactive to light.  Neck: Normal range of motion.  Cardiovascular: Normal rate, regular rhythm and normal heart sounds.   Pulmonary/Chest: Effort normal and breath sounds normal. No respiratory distress. He has no wheezes.  Abdominal: Soft. Bowel sounds are normal. There is tenderness in the left lower quadrant. There is  no guarding and no CVA tenderness.    Tenderness in left lower quadrant, no rebound or guarding, no CVA tenderness  Musculoskeletal: Normal range of motion.  Neurological: He is alert and oriented to person, place, and time.  Skin: Skin is warm and dry. He is not diaphoretic.  Psychiatric: He has a normal mood and affect.  Nursing note and vitals reviewed.   ED Course  Procedures (including critical care time) Labs Review Labs Reviewed  CBC WITH DIFFERENTIAL/PLATELET - Abnormal; Notable for the  following:    Hemoglobin 11.9 (*)    HCT 37.7 (*)    MCH 25.9 (*)    All other components within normal limits  COMPREHENSIVE METABOLIC PANEL - Abnormal; Notable for the following:    Glucose, Bld 110 (*)    Calcium 8.5 (*)    All other components within normal limits  LIPASE, BLOOD  URINALYSIS, ROUTINE W REFLEX MICROSCOPIC (NOT AT [facility])    Imaging Review Ct Abdomen Pelvis W Contrast  06/04/2015  CLINICAL DATA:  Left lower quadrant pain for 3 days. EXAM: CT ABDOMEN AND PELVIS WITH CONTRAST TECHNIQUE: Multidetector CT imaging of the abdomen and pelvis was performed using the standard protocol following bolus administration of intravenous contrast. CONTRAST:  1 ISOVUE-300 IOPAMIDOL (ISOVUE-300) INJECTION 61% COMPARISON:  CT scan dated 06/30/2006 FINDINGS: Lower chest:  Normal. Hepatobiliary: Normal. Pancreas: Normal. Spleen: Normal. Adrenals/Urinary Tract: Normal. Stomach/Bowel: Normal. Vascular/Lymphatic: Normal. Reproductive: Normal. Other: No free air or free fluid. Musculoskeletal: Small broad-based disc bulge at L5-S1 asymmetric to the right. Otherwise normal. IMPRESSION: Benign appearing abdomen and pelvis. Electronically Signed   By: Francene Boyers M.D.   On: 06/04/2015 09:23   I have personally reviewed and evaluated these images and lab results as part of my medical decision-making.   EKG Interpretation None      MDM   Final diagnoses:  Left lower quadrant pain   35 year old male here with left lower abdominal pain for the past 2 days. Seen yesterday for the same but reports worsening symptoms. Patient is afebrile, nontoxic. He is tenderness in the left lower quadrant without rebound or guarding. No peritoneal signs. Labwork today again appears reassuring. CT scan was obtained which is negative for acute findings. Patient has remained without any nausea, vomiting, or diarrhea here in the ED. He appears stable for discharge. Encouraged follow-up with his PCP on Monday if symptoms  persist. Rx percocet, zofran.  Discussed plan with patient, he/she acknowledged understanding and agreed with plan of care.  Return precautions given for new or worsening symptoms.  Garlon Hatchet, PA-C 06/04/15 1110  April Palumbo, MD 06/06/15 2259

## 2015-06-09 ENCOUNTER — Encounter: Payer: Self-pay | Admitting: Family Medicine

## 2015-06-09 ENCOUNTER — Ambulatory Visit (INDEPENDENT_AMBULATORY_CARE_PROVIDER_SITE_OTHER): Payer: 59 | Admitting: Family Medicine

## 2015-06-09 VITALS — BP 127/79 | HR 77 | Temp 98.7°F | Ht 72.0 in | Wt 250.0 lb

## 2015-06-09 DIAGNOSIS — R1032 Left lower quadrant pain: Secondary | ICD-10-CM | POA: Diagnosis not present

## 2015-06-09 NOTE — Progress Notes (Signed)
   Subjective:    Patient ID: Benjamin Stone, male    DOB: 1981-01-16, 35 y.o.   MRN: [id number]  HPI  Abdomen Pain Er follow up.  Started about 5 days ago focal LLQ pain that waxwed and waned then became severe and he presented to ER on 4/28.  Dorna BloomWu was negative.  Pain has gradually improved since then.  No nausea and vomiting or fever or bleeding.  Took only one percocet 3 days ago.    Reviewed ER labs and CT personally   Chief Complaint noted Review of Symptoms - see HPI PMH - Smoking status noted.   Vital Signs reviewed    Review of Systems     Objective:   Physical Exam   Alert nad Abdomen: soft and with very mild LLQ focal tenderness, without masses, organomegaly or hernias noted.  No guarding or rebound No CVAT  Skin:  Intact without suspicious lesions or rashes      Assessment & Plan:    Abdomen Pain  - no clear etiology.  No red flags.  Continue to monitor

## 2015-06-09 NOTE — Patient Instructions (Signed)
Good to see you today!  Thanks for coming in.   Abdominal Pain  Call us or go to the ER if you have severe pain or bleeding or high fever or pain with urination

## 2016-03-09 ENCOUNTER — Ambulatory Visit (INDEPENDENT_AMBULATORY_CARE_PROVIDER_SITE_OTHER): Payer: 59 | Admitting: Family Medicine

## 2016-03-09 ENCOUNTER — Encounter: Payer: Self-pay | Admitting: Family Medicine

## 2016-03-09 VITALS — BP 122/74 | HR 82 | Temp 98.2°F | Ht 72.0 in | Wt 256.0 lb

## 2016-03-09 DIAGNOSIS — E7800 Pure hypercholesterolemia, unspecified: Secondary | ICD-10-CM | POA: Diagnosis not present

## 2016-03-09 DIAGNOSIS — Z79899 Other long term (current) drug therapy: Secondary | ICD-10-CM | POA: Diagnosis not present

## 2016-03-09 DIAGNOSIS — Z13220 Encounter for screening for lipoid disorders: Secondary | ICD-10-CM | POA: Diagnosis not present

## 2016-03-09 DIAGNOSIS — M25579 Pain in unspecified ankle and joints of unspecified foot: Secondary | ICD-10-CM | POA: Diagnosis not present

## 2016-03-09 DIAGNOSIS — E669 Obesity, unspecified: Secondary | ICD-10-CM

## 2016-03-09 LAB — LIPID PANEL
CHOL/HDL RATIO: 6.7 ratio — AB (ref <5.0)
CHOLESTEROL: 215 mg/dL — AB (ref <200)
HDL: 32 mg/dL — AB (ref >40)
LDL Cholesterol: 161 mg/dL — ABNORMAL HIGH (ref <100)
TRIGLYCERIDES: 108 mg/dL (ref <150)
VLDL: 22 mg/dL (ref <30)

## 2016-03-09 LAB — COMPLETE METABOLIC PANEL WITH GFR
ALBUMIN: 4.2 g/dL (ref 3.6–5.1)
ALK PHOS: 105 U/L (ref 40–115)
ALT: 29 U/L (ref 9–46)
AST: 23 U/L (ref 10–40)
BUN: 14 mg/dL (ref 7–25)
CALCIUM: 9 mg/dL (ref 8.6–10.3)
CHLORIDE: 105 mmol/L (ref 98–110)
CO2: 24 mmol/L (ref 20–31)
Creat: 1.32 mg/dL (ref 0.60–1.35)
GFR, EST AFRICAN AMERICAN: 80 mL/min (ref >=60)
GFR, EST NON AFRICAN AMERICAN: 69 mL/min (ref >=60)
Glucose, Bld: 95 mg/dL (ref 65–99)
POTASSIUM: 4.5 mmol/L (ref 3.5–5.3)
Sodium: 141 mmol/L (ref 135–146)
Total Bilirubin: 0.4 mg/dL (ref 0.2–1.2)
Total Protein: 7.7 g/dL (ref 6.1–8.1)

## 2016-03-09 NOTE — Patient Instructions (Addendum)
Your Blood pressure looks great.   You are in the obesity range.  Try eating three meals a day and three snakes a day.   We are checking your blood sugar, liver, renal, electrolytes, rheumatoid factors.   Dr Danie Diehl will call you if your tests are not good. Otherwise he will send you a letter.  If you sign up for MyChart online, you will be able to see your test results once Dr Lamichael Youkhana has reviewed them.  If you do not hear from us with in 2 weeks please call our office

## 2016-03-10 ENCOUNTER — Telehealth: Payer: Self-pay | Admitting: Family Medicine

## 2016-03-10 ENCOUNTER — Encounter: Payer: Self-pay | Admitting: Family Medicine

## 2016-03-10 DIAGNOSIS — M25579 Pain in unspecified ankle and joints of unspecified foot: Secondary | ICD-10-CM | POA: Insufficient documentation

## 2016-03-10 DIAGNOSIS — L219 Seborrheic dermatitis, unspecified: Secondary | ICD-10-CM | POA: Insufficient documentation

## 2016-03-10 DIAGNOSIS — E669 Obesity, unspecified: Secondary | ICD-10-CM

## 2016-03-10 DIAGNOSIS — E66811 Obesity, class 1: Secondary | ICD-10-CM

## 2016-03-10 DIAGNOSIS — E7800 Pure hypercholesterolemia, unspecified: Secondary | ICD-10-CM | POA: Insufficient documentation

## 2016-03-10 HISTORY — DX: Obesity, class 1: E66.811

## 2016-03-10 HISTORY — DX: Obesity, unspecified: E66.9

## 2016-03-10 LAB — SEDIMENTATION RATE: SED RATE: 34 mm/h — AB (ref 0–15)

## 2016-03-10 LAB — RHEUMATOID FACTOR

## 2016-03-10 NOTE — Progress Notes (Signed)
   Subjective:    Patient ID: Benjamin Stone, male    DOB: [birth year], 36 y.o.   MRN: [id number] Benjamin Stone is alone Sources of clinical information for visit is/are patient and past medical records. Nursing assessment for this office visit was reviewed with the patient for accuracy and revision.   HPI CC: Rash RASH  Location: eyebrows, around nose and upper lip Onset: several years ago  Course: intermittent, stable Self-treated with: emolient creams             Improvement with treatment: nothing  History Pruritis: yes, mild  Tenderness: no  New medications/antibiotics: no  Tick/insect/pet exposure: no  Recent travel: no  New detergent, new clothing, or other topical exposure: no   Red Flags Feeling ill: no  Fever: no  Mouth lesions: no  Facial/tongue swelling/difficulty breathing:  no  Diabetic or immunocompromised: no   Ankle discomfort Onset: within last year Location: bilateral sides of both ankles, it feels tight  Quality: tightness Severity: mild Function: stiff with standing after prolonged sitting at job Pattern: daily Course: stable Radiation: no Relief: standing and walking around releases the tightness in ankles within a few minutes Precipitant: none identified Associated Symptoms: no rash, no fever, no oral lesions, no ortho joints with discomfort, no dyspnea Trauma (Acute or Chronic): none, 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  Has gained weight in last few years In mutually monogamous heterosexual relation. Drink shot of spirits once a month.  Medications: none  Review of Systems No cold intolerance No headache No change in vision    Objective:   Physical Exam VS reviewed GEN: Alert, Cooperative, Groomed, NAD HEENT: PERRL; EAC bilaterally not occluded, TM's translucent with  normal LM, (+) LR;                No cervical LAN, No thyromegaly, No palpable masses COR: RRR, No M/G/R, No JVD, Normal PMI size and location LUNGS: BCTA, No Acc mm use, speaking in full sentences ABDOMEN: (+)BS, soft, NT, ND, No HSM, No palpable masses EXT: No peripheral leg edema. Feet without deformity or lesions. Palpable bilateral pedal pulses. Macerated skin in all toe webs.  Bilateral flat feet.  Intact lateral and anterior drawer of ankles.  No erythema or edema of ankles, normal appearance of ankles bilaterally, normal gait. Full passive and Active ROM flex/ext of ankles.  SKIN: mild erythema in T-zone of face, involving nasolabial folds, mild small scaling.   Psych: Normal affect/thought/speech/language        Assessment & Plan:  See problem list

## 2016-03-10 NOTE — Assessment & Plan Note (Signed)
Lab Results  Component Value Date   CHOL 215 (H) 03/09/2016   HDL 32 (L) 03/09/2016   LDLCALC 161 (H) 03/09/2016   TRIG 108 03/09/2016   CHOLHDL 6.7 (H) 03/09/2016   Goal LDL less than 160 Weight loss 5 to 7 percent discussed.   Recheck in 6 months.

## 2016-03-10 NOTE — Assessment & Plan Note (Signed)
Established problem Low activity level, high fat and carbohydrate diet and large portions Eating only twice a day Patient work on limiting portion size, and eating 3 meals with 3 light snacks a day

## 2016-03-10 NOTE — Assessment & Plan Note (Signed)
New problem Selsun Blue or Haed n' Shoulder 3 times a week until controlled then once a week. Lamisil cream (OTC) dailt to T-Zone of face until controlled then daily prn recurrence

## 2016-03-10 NOTE — Assessment & Plan Note (Signed)
New problem ESR 34 mm/Hr with RF (-) Uncertain origin of this abnormal tightness in ankles Recommend Naprosyn for discomfort as needed Active Range of motion exercises when seated at work.  RTC if condition worsens or fails to improve.

## 2016-03-10 NOTE — Telephone Encounter (Signed)
I discussed LDL results and ESR results with Linton Rump

## 2016-04-18 ENCOUNTER — Encounter: Payer: Self-pay | Admitting: Family Medicine

## 2016-06-29 ENCOUNTER — Encounter: Payer: Self-pay | Admitting: Family Medicine

## 2016-07-04 ENCOUNTER — Other Ambulatory Visit: Payer: Self-pay | Admitting: Family Medicine

## 2016-07-04 MED ORDER — PENICILLIN V POTASSIUM 500 MG PO TABS: 500.0000 mg | ORAL_TABLET | Freq: Four times a day (QID) | ORAL | 0 refills | Status: AC

## 2016-07-04 NOTE — Progress Notes (Signed)
Rx Pen VK for dental pain while waiting to see dentist.

## 2016-09-20 ENCOUNTER — Encounter: Payer: Self-pay | Admitting: Family Medicine

## 2016-10-16 ENCOUNTER — Encounter: Payer: Self-pay | Admitting: Family Medicine

## 2016-10-23 ENCOUNTER — Other Ambulatory Visit: Payer: Self-pay | Admitting: Family Medicine

## 2016-10-23 DIAGNOSIS — Z7251 High risk heterosexual behavior: Secondary | ICD-10-CM

## 2016-10-23 NOTE — Progress Notes (Unsigned)
Orders placed to test for HIV, Syphilis, GC and Chlamydia infections from high risk sexual encounter.  Mr Sites is to come into [facility] lab at his convenience to collect urine and blood for testing.

## 2017-03-04 IMAGING — CT CT ABD-PELV W/ CM
2 of 4 series · 17 of 46 positions shown, 19 images · IV contrast (Omni 300)
Comparison: CT scan dated 06/30/2006

CLINICAL DATA: Left lower quadrant pain for 3 days.

EXAM:
CT ABDOMEN AND PELVIS WITH CONTRAST
TECHNIQUE: Multidetector CT imaging of the abdomen and pelvis was performed
using the standard protocol following bolus administration of
intravenous contrast.
CONTRAST:  1 AFAHW6-Z77 IOPAMIDOL (AFAHW6-Z77) INJECTION 61%

[Series 2: a/p w/ 5mm · axial · 0.86mm/px · z∈[-540,-95]mm · 14 of 99 slices shown, 16 images]
[im 5/99  soft-tissue]
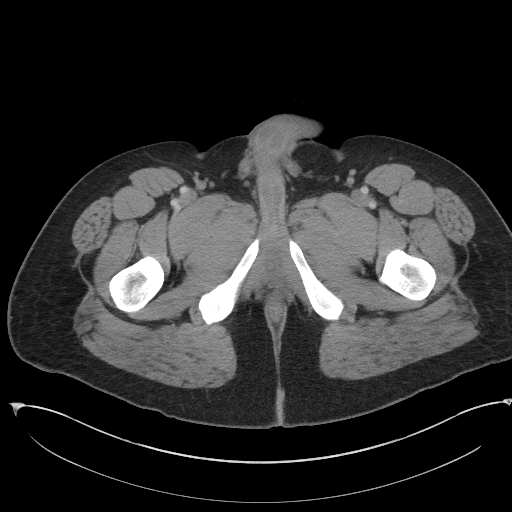
[im 5/99  bone]
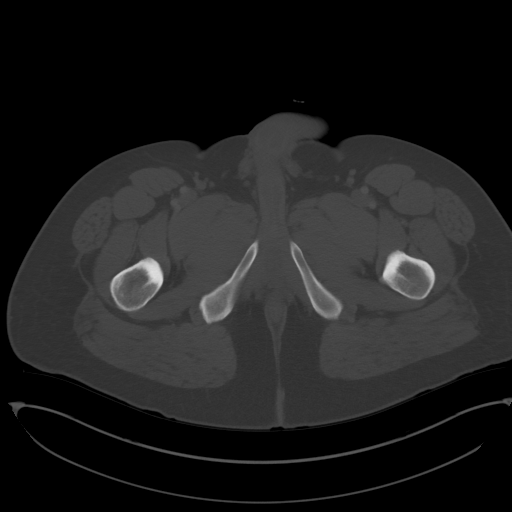
[im 13/99  soft-tissue]
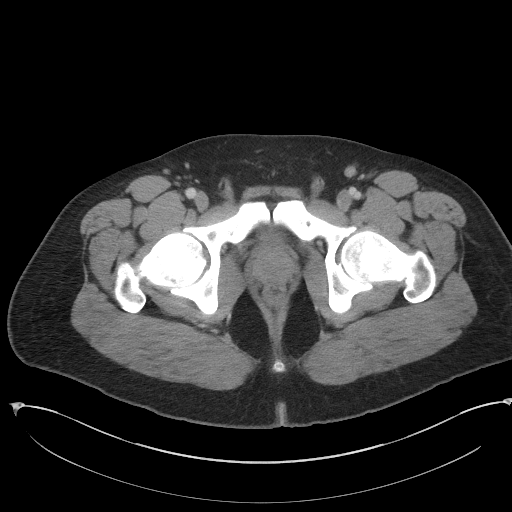
[im 21/99  soft-tissue]
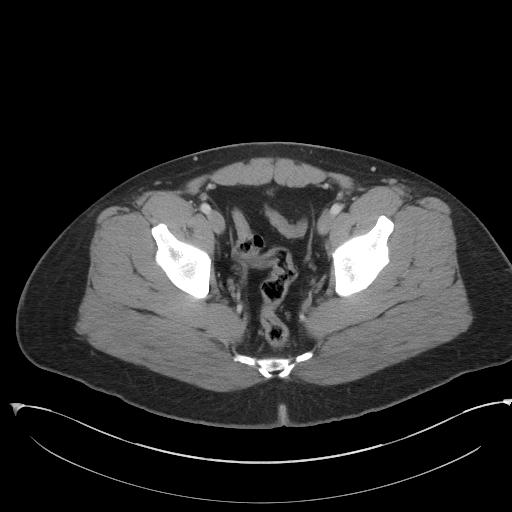
[im 25/99  soft-tissue]
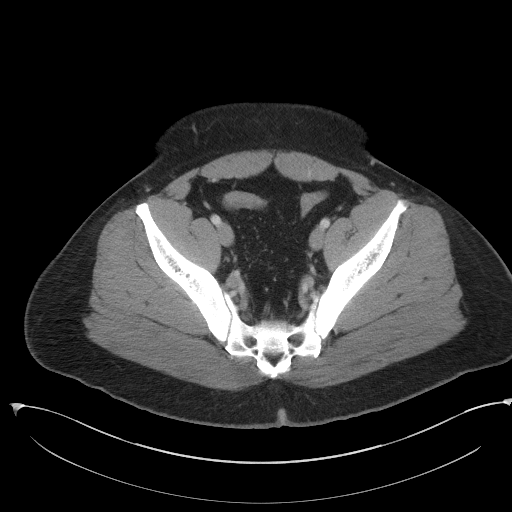
[im 33/99  soft-tissue]
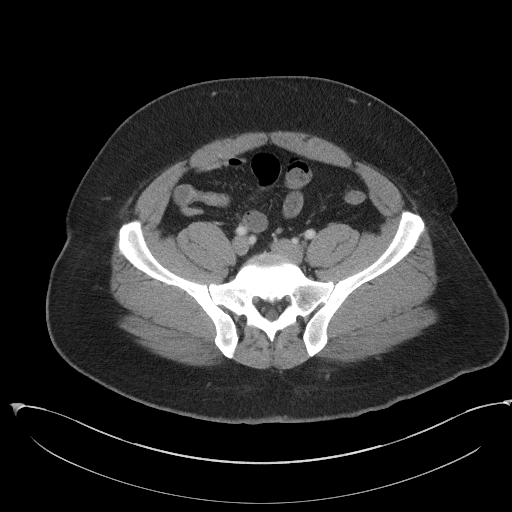
[im 41/99  soft-tissue]
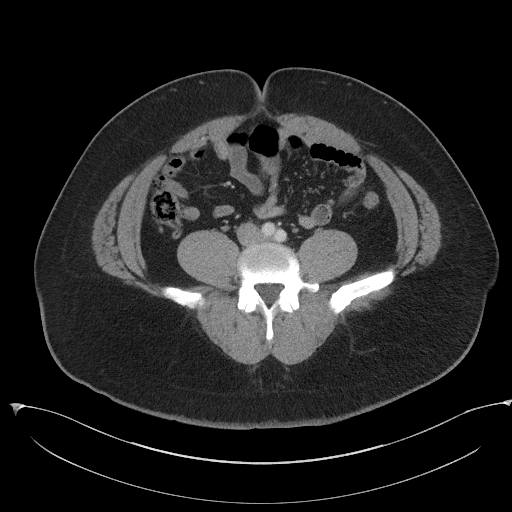
[im 45/99  soft-tissue]
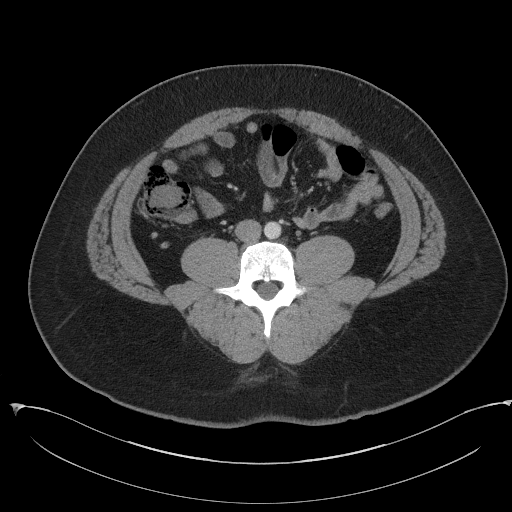
[im 54/99  soft-tissue]
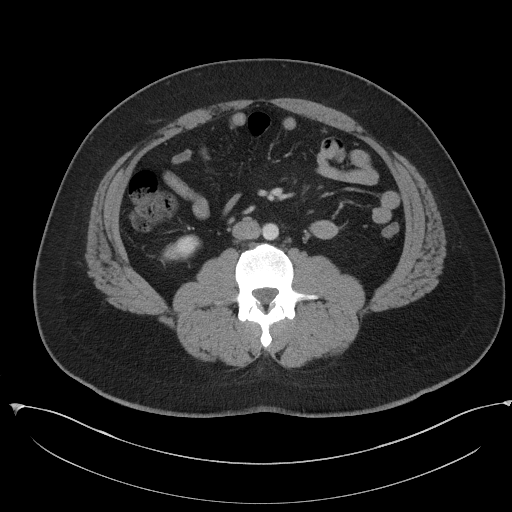
[im 58/99  soft-tissue]
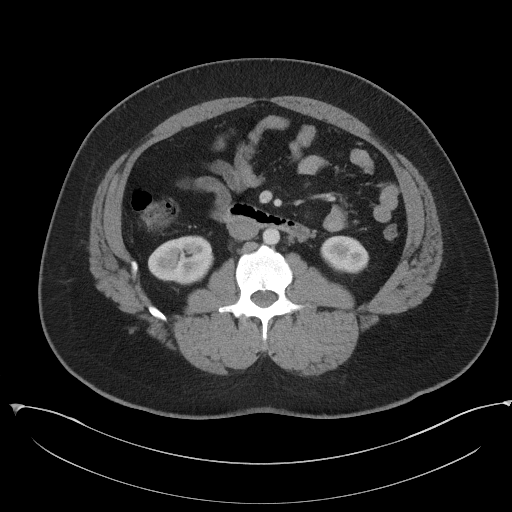
[im 58/99  bone]
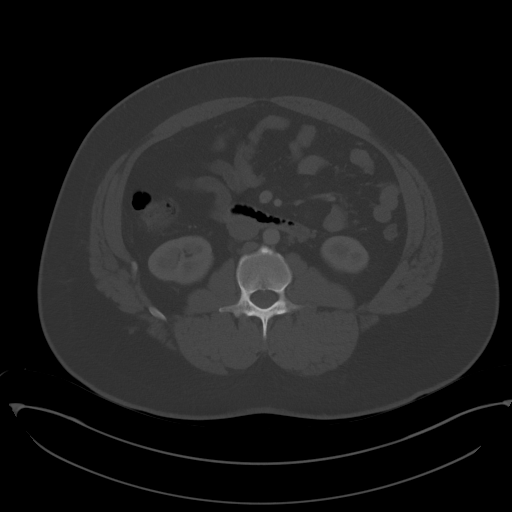
[im 66/99  soft-tissue]
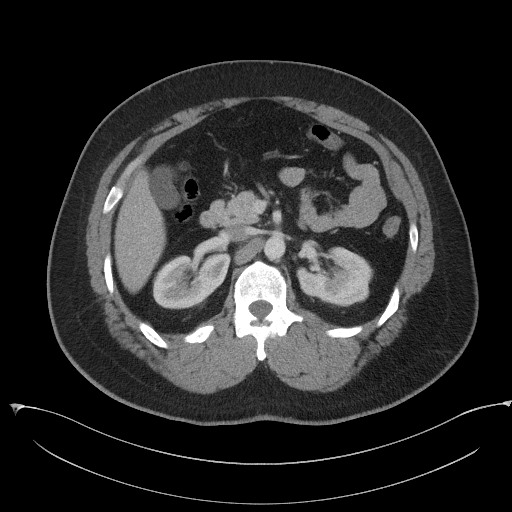
[im 74/99  soft-tissue]
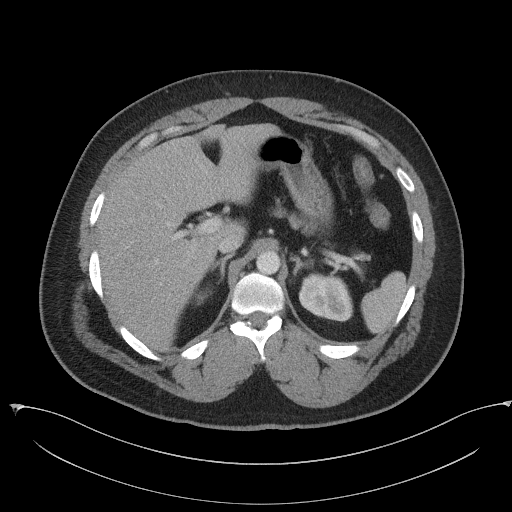
[im 78/99  soft-tissue]
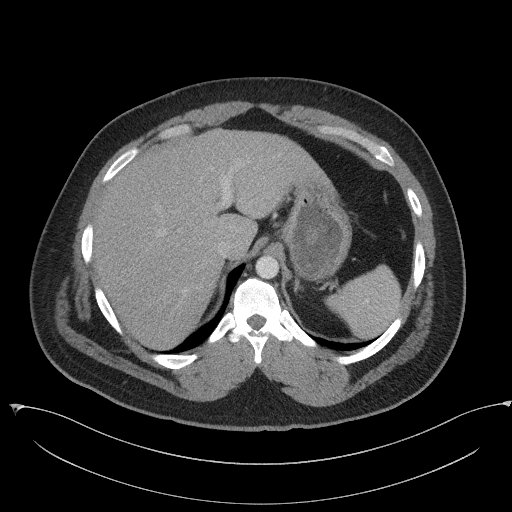
[im 86/99  soft-tissue]
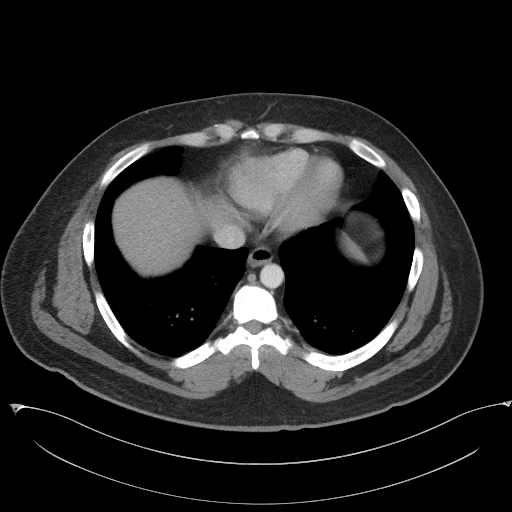
[im 94/99  soft-tissue]
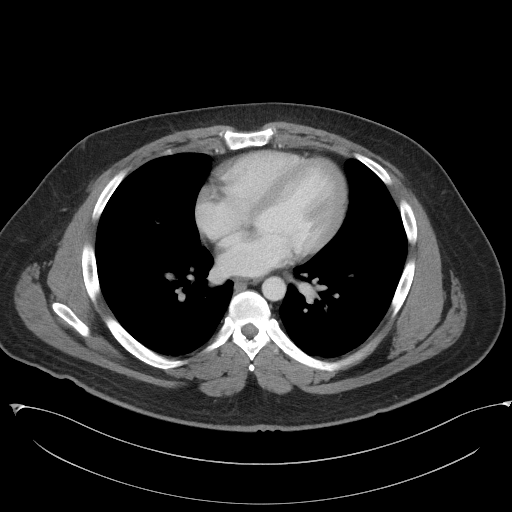

[Series 5: a/p w/ cor · coronal · 1.01mm/px · 3 of 163 slices shown]
[im 55/163  soft-tissue]
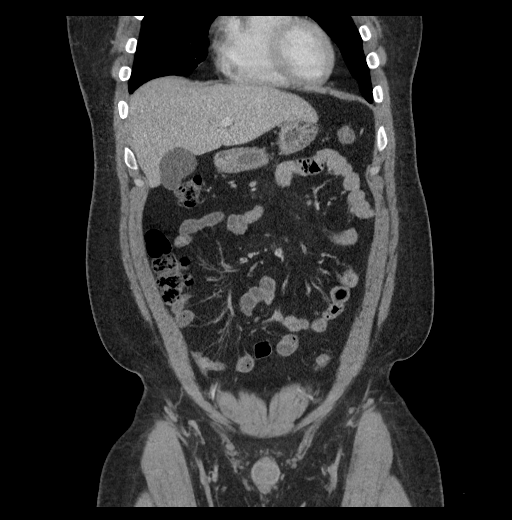
[im 73/163  soft-tissue]
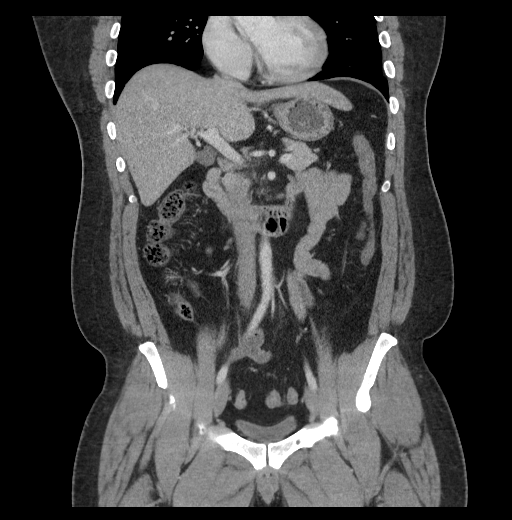
[im 91/163  soft-tissue]
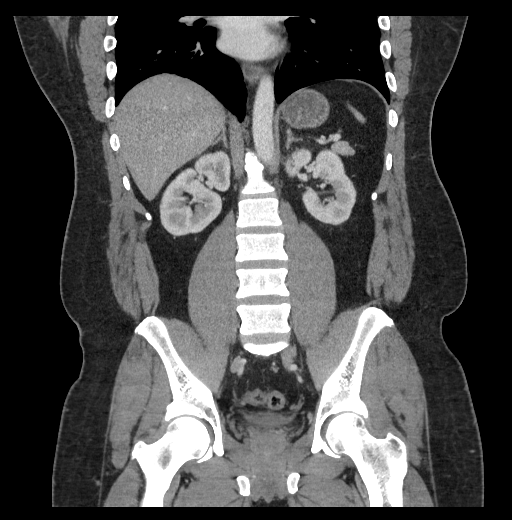

[17 of 46 positions shown; findings below may reference images not displayed]

FINDINGS: Lower chest:  Normal.

Hepatobiliary: Normal.

Pancreas: Normal.

Spleen: Normal.

Adrenals/Urinary Tract: Normal.

Stomach/Bowel: Normal.

Vascular/Lymphatic: Normal.

Reproductive: Normal.

Other: No free air or free fluid.

Musculoskeletal: Small broad-based disc bulge at L5-S1 asymmetric to
the right. Otherwise normal.
IMPRESSION: Benign appearing abdomen and pelvis.

## 2018-03-24 ENCOUNTER — Emergency Department (HOSPITAL_BASED_OUTPATIENT_CLINIC_OR_DEPARTMENT_OTHER): Payer: BLUE CROSS/BLUE SHIELD

## 2018-03-24 ENCOUNTER — Emergency Department (HOSPITAL_BASED_OUTPATIENT_CLINIC_OR_DEPARTMENT_OTHER)
Admission: EM | Admit: 2018-03-24 | Discharge: 2018-03-24 | Disposition: A | Discharge status: Home / Self Care | Payer: BLUE CROSS/BLUE SHIELD | Attending: Emergency Medicine | Admitting: Emergency Medicine

## 2018-03-24 ENCOUNTER — Encounter (HOSPITAL_BASED_OUTPATIENT_CLINIC_OR_DEPARTMENT_OTHER): Payer: Self-pay | Admitting: Emergency Medicine

## 2018-03-24 ENCOUNTER — Other Ambulatory Visit: Payer: Self-pay

## 2018-03-24 DIAGNOSIS — Z87891 Personal history of nicotine dependence: Secondary | ICD-10-CM | POA: Insufficient documentation

## 2018-03-24 DIAGNOSIS — R0789 Other chest pain: Secondary | ICD-10-CM

## 2018-03-24 DIAGNOSIS — R071 Chest pain on breathing: Secondary | ICD-10-CM | POA: Insufficient documentation

## 2018-03-24 HISTORY — DX: Unspecified osteoarthritis, unspecified site: M19.90

## 2018-03-24 NOTE — ED Provider Notes (Signed)
[facility] EMERGENCY DEPARTMENT Provider Note   CSN: [id number] Arrival date & time: 03/24/18  0705     History   Chief Complaint No chief complaint on file.   HPI Benjamin Stone is a 38 y.o. male.  Patient is a 38 year old male presenting today with sharp chest pain just in the sternal area.  He states over the last month or 2 he has noticed it when getting up out of bed in the morning.  He states usually it only lasts seconds but is a very sharp pain usually 7 or 8 out of 10.  He states it always goes away and he never give it much thought.  However yesterday he had the same pain but it lasted longer and then was a 2 or 3 throughout the day worse when he lifted his arms up over his head, bent over to put his shoes or socks on or put his arm in certain location.  It finally eased off by the end of the day but in the middle of the night he rolled over and it woke him up due to the pain and it was present still this morning.  He states it is now painful when he takes a deep breath as well.  The history is provided by the patient.  Chest Pain  Pain location:  Substernal area Pain quality: sharp   Pain radiates to:  Does not radiate Pain severity:  Moderate Onset quality:  Gradual Duration: for a few months would feel pain when he would get up in the morning or move arms certain ways but since yesterday pain has been more constant. Progression:  Worsening Chronicity:  New Context: lifting and raising an arm   Context comment:  Patient states he usually sleeps on his side and had noticed it over the last few months when he would roll over or stretch a certain way.  Yesterday when he rolled over in bed he noticed the pain and this time it did not go away.   Relieved by:  Rest Worsened by:  Certain positions, deep breathing and movement Ineffective treatments:  Rest Associated symptoms: no abdominal pain, no back pain, no cough, no diaphoresis, no dizziness, no fever, no  headache, no lower extremity edema, no shortness of breath, no vomiting and no weakness   Risk factors comment:  Prior history of alcohol abuse greater than 10 years ago and used tobacco greater than 10 years ago but does not use alcohol tobacco or drugs now   Past Medical History:  Diagnosis Date  . Alcohol dependence in remission (HCC)    Abstinence starting 2011  . Allergic rhinitis   . Anxiety 01/29/2012  . Arthritis   . Headache(784.0) 01/30/2012  . Obesity (BMI 30.0-34.9) 03/10/2016  . RENAL CALCULUS, URIC ACID, HX OF 07/10/2006   Qualifier: Diagnosis of  By: McDiarmid MD, Todd      Patient Active Problem List   Diagnosis Date Noted  . bilateral ankle discomfort 03/10/2016  . Pure hypercholesterolemia 03/10/2016  . Obesity (BMI 30.0-34.9) 03/10/2016  . Seborrhea 03/10/2016    Past Surgical History:  Procedure Laterality Date  . TONSILLECTOMY    . TOOTH EXTRACTION  2015   Feb 17th         Home Medications    Prior to Admission medications   Not on File    Family History Family History  Problem Relation Age of Onset  . Bipolar disorder Mother   . Psoriasis Mother   .  Diabetes Mellitus II Mother   . Bipolar disorder Sister     Social History Social History   Tobacco Use  . Smoking status: Former Smoker    Last attempt to quit: 01/29/2005    Years since quitting: 13.1  . Smokeless tobacco: Never Used  Substance Use Topics  . Alcohol use: Not Currently    Alcohol/week: 1.0 standard drinks    Types: 1 Shots of liquor per week    Comment: Hisotry of Heavy Alcohol use after returning from [place] in 2004  . Drug use: No     Allergies   Patient has no known allergies.   Review of Systems Review of Systems  Constitutional: Negative for diaphoresis and fever.  Respiratory: Negative for cough and shortness of breath.   Cardiovascular: Positive for chest pain.  Gastrointestinal: Negative for abdominal pain and vomiting.  Musculoskeletal: Negative for  back pain.  Neurological: Negative for dizziness, weakness and headaches.  All other systems reviewed and are negative.    Physical Exam Updated Vital Signs BP (!) 148/93   Pulse 76   Temp 97.8 F (36.6 C) (Oral)   Resp 20   Ht 5\' 11"  (1.803 m)   Wt 115.7 kg   SpO2 100%   BMI 35.57 kg/m   Physical Exam Vitals signs and nursing note reviewed.  Constitutional:      General: He is not in acute distress.    Appearance: He is well-developed. He is not ill-appearing.  HENT:     Head: Normocephalic and atraumatic.  Eyes:     Conjunctiva/sclera: Conjunctivae normal.     Pupils: Pupils are equal, round, and reactive to light.  Neck:     Musculoskeletal: Normal range of motion and neck supple.  Cardiovascular:     Rate and Rhythm: Normal rate and regular rhythm.     Heart sounds: No murmur.  Pulmonary:     Effort: Pulmonary effort is normal. No respiratory distress.     Breath sounds: Decreased breath sounds present. No wheezing or rales.     Comments: Decreased breath sounds due to effort Chest:    Abdominal:     General: There is no distension.     Palpations: Abdomen is soft.     Tenderness: There is no abdominal tenderness. There is no guarding or rebound.  Musculoskeletal: Normal range of motion.        General: No tenderness.  Skin:    General: Skin is warm and dry.     Findings: No erythema or rash.  Neurological:     General: No focal deficit present.     Mental Status: He is alert and oriented to person, place, and time. Mental status is at baseline.  Psychiatric:        Mood and Affect: Mood normal.        Behavior: Behavior normal.        Thought Content: Thought content normal.      ED Treatments / Results  Labs (all labs ordered are listed, but only abnormal results are displayed) Labs Reviewed - No data to display  EKG EKG Interpretation  Date/Time:  Sunday March 24 2018 07:17:56 EST Ventricular Rate:  70 PR Interval:    QRS  Duration: 90 QT Interval:  384 QTC Calculation: 415 R Axis:   11 Text Interpretation:  Sinus rhythm Normal ECG Confirmed by Gwyneth SproutPlunkett, Cayce Quezada ([id number]) on 03/24/2018 7:44:20 AM   Radiology Dg Chest 2 View  Result Date: 03/24/2018 CLINICAL DATA:  Sternal region pain EXAM: CHEST - 2 VIEW COMPARISON:  None. FINDINGS: Lungs are clear. The heart size and pulmonary vascularity are normal. No adenopathy. No pneumothorax. No evident bone lesions. IMPRESSION: No edema or consolidation.  No bony lesions evident. Electronically Signed   By: Bretta Bang III M.D.   On: 03/24/2018 08:06    Procedures Procedures (including critical care time)  Medications Ordered in ED Medications - No data to display   Initial Impression / Assessment and Plan / ED Course  I have reviewed the triage vital signs and the nursing notes.  Pertinent labs & imaging results that were available during my care of the patient were reviewed by me and considered in my medical decision making (see chart for details).     Pt with atypical story for CP which seems to be more chest wall pain.  Low suspicion for PE and patient is PERC negative, low suspicion for ACS as patient has a normal EKG and Heart score of 1.  Patient denies any infectious symptoms and low suspicion for pneumonia.  Low suspicion for pneumothorax as well as patient has no shortness of breath but does have some pleuritic pain.  Suspect it is musculoskeletal.  He is not a smoker and does not have any type of allergy symptoms at this time.  We will get a chest x-ray and if normal will recommend NSAIDs and follow-up with PCP.  CXR is wnl.   Final Clinical Impressions(s) / ED Diagnoses   Final diagnoses:  Sternal pain    ED Discharge Orders    None       Gwyneth Sprout, MD 03/24/18 [phone]

## 2018-03-24 NOTE — ED Notes (Signed)
ED Provider at bedside. 

## 2018-03-24 NOTE — ED Notes (Signed)
Patient transported to X-ray 

## 2018-03-24 NOTE — ED Triage Notes (Signed)
For the last 2 months , off and on, having chest pain. Worse with any kind of movement

## 2019-06-28 ENCOUNTER — Encounter: Payer: Self-pay | Admitting: Family Medicine

## 2019-10-07 ENCOUNTER — Ambulatory Visit: Payer: BC Managed Care – PPO | Attending: Internal Medicine

## 2019-10-07 DIAGNOSIS — Z23 Encounter for immunization: Secondary | ICD-10-CM

## 2019-10-07 NOTE — Progress Notes (Signed)
   Covid-19 Vaccination Clinic  Name:  ERHARDT DADA    MRN: [id number] DOB: [birth year]  10/07/2019  Mr. Shader was observed post Covid-19 immunization for 15 minutes without incident. He was provided with Vaccine Information Sheet and instruction to access the V-Safe system.   Mr. Ruggiero was instructed to call 911 with any severe reactions post vaccine: Marland Kitchen Difficulty breathing  . Swelling of face and throat  . A fast heartbeat  . A bad rash all over body  . Dizziness and weakness   Immunizations Administered    Name Date Dose VIS Date Route   Pfizer COVID-19 Vaccine 10/07/2019  9:43 AM 0.3 mL 04/02/2018 Intramuscular   Manufacturer: [employer], [employer]   Lot: [id number]   NDC: [address]-7

## 2019-10-28 ENCOUNTER — Ambulatory Visit: Payer: BC Managed Care – PPO | Attending: Critical Care Medicine

## 2019-10-28 DIAGNOSIS — Z23 Encounter for immunization: Secondary | ICD-10-CM

## 2019-10-28 NOTE — Progress Notes (Signed)
   Covid-19 Vaccination Clinic  Name:  Benjamin Stone    MRN: [id number] DOB: 1980-11-22  10/28/2019  [name] was observed post Covid-19 immunization for 15 minutes without incident. He was provided with Vaccine Information Sheet and instruction to access the V-Safe system.   [name] was instructed to call 911 with any severe reactions post vaccine: Marland Kitchen Difficulty breathing  . Swelling of face and throat  . A fast heartbeat  . A bad rash all over body  . Dizziness and weakness   Immunizations Administered    Name Date Dose VIS Date Route   Pfizer COVID-19 Vaccine 10/28/2019  9:35 AM 0.3 mL 04/02/2018 Intramuscular   Manufacturer: [employer], [employer]   Lot: [id number]   NDC: [address]-1

## 2019-12-23 IMAGING — CR DG CHEST 2V
2 series · 2 of 2 positions shown · non-contrast
Comparison: None.

CLINICAL DATA: Sternal region pain

EXAM:
CHEST - 2 VIEW

[w chest pa]
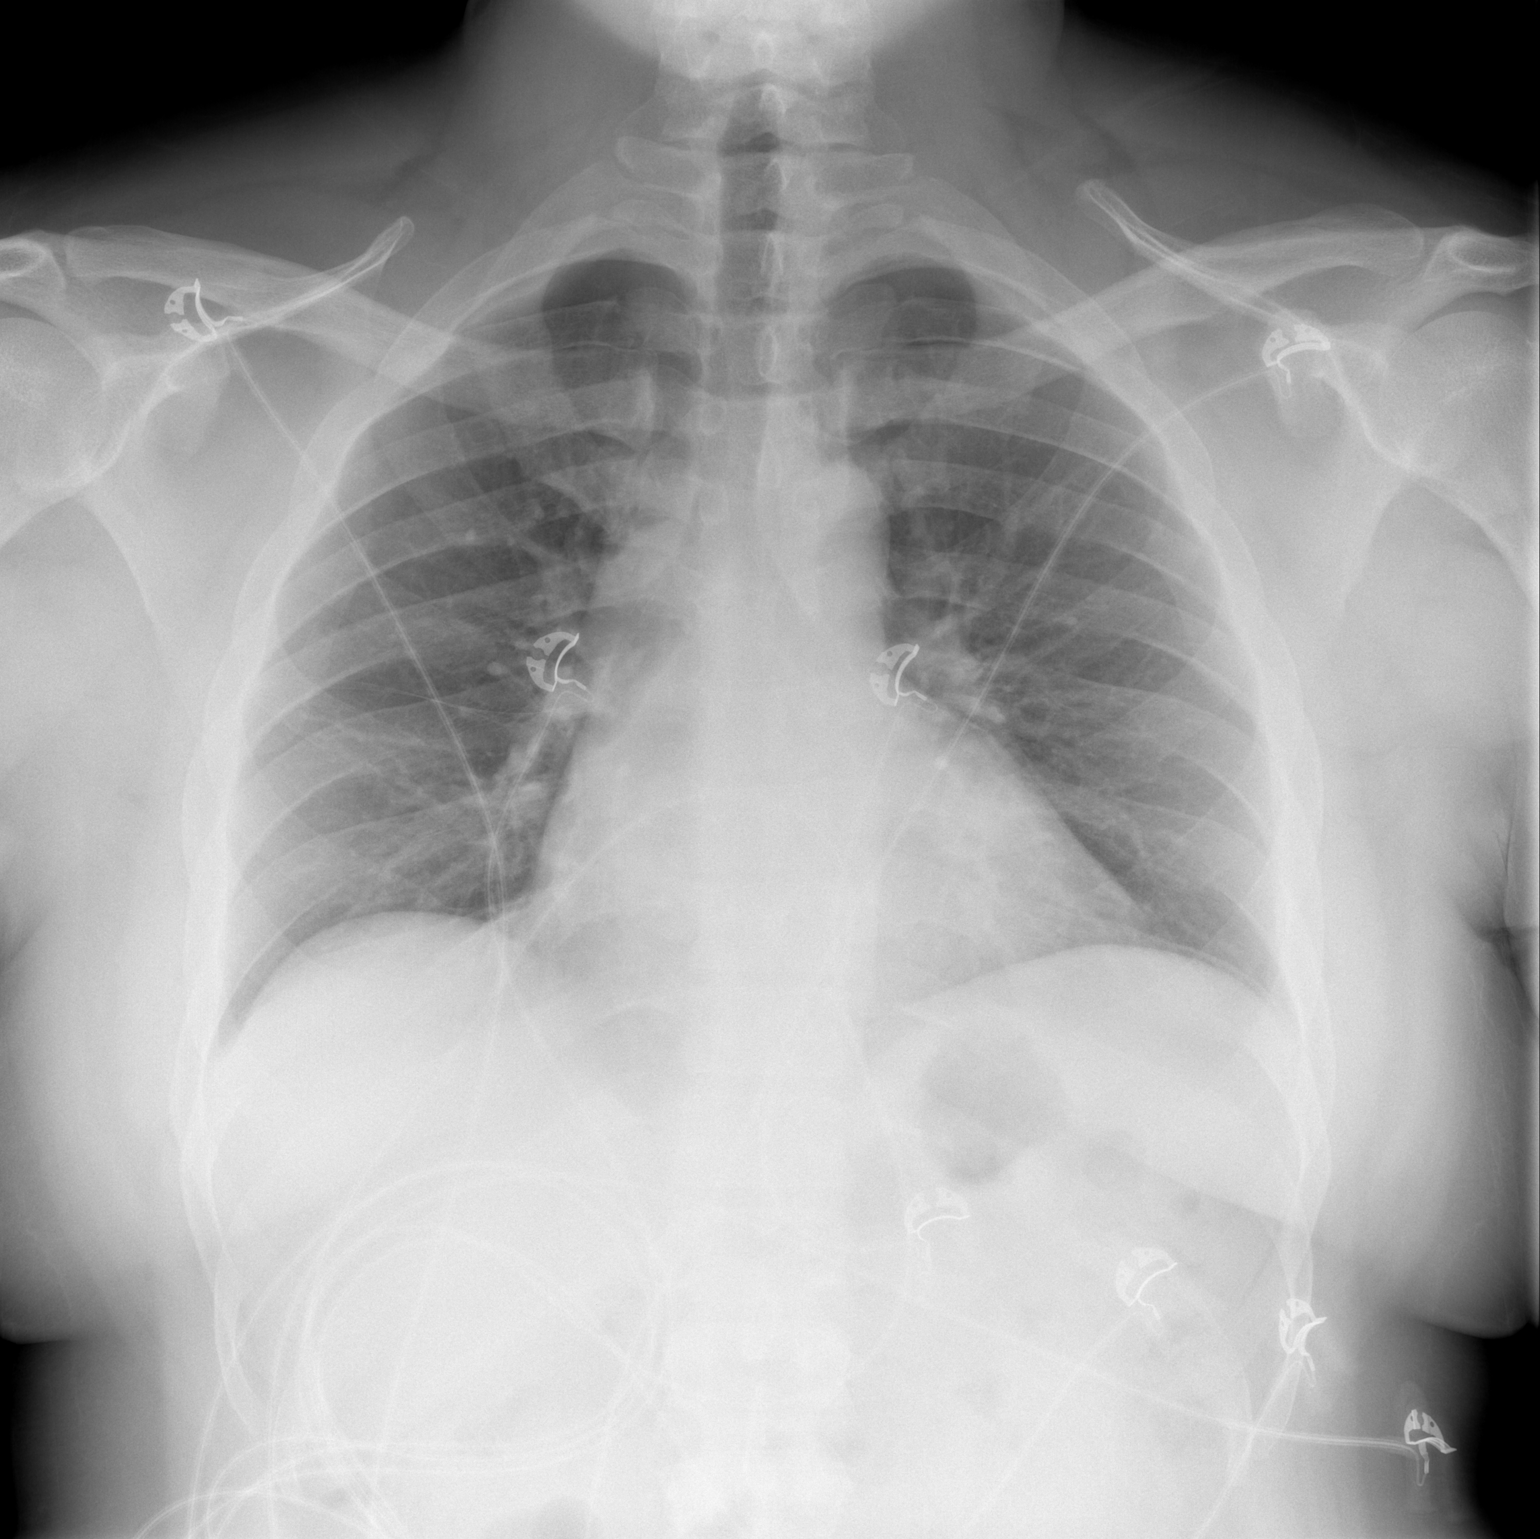

[w chest lat]
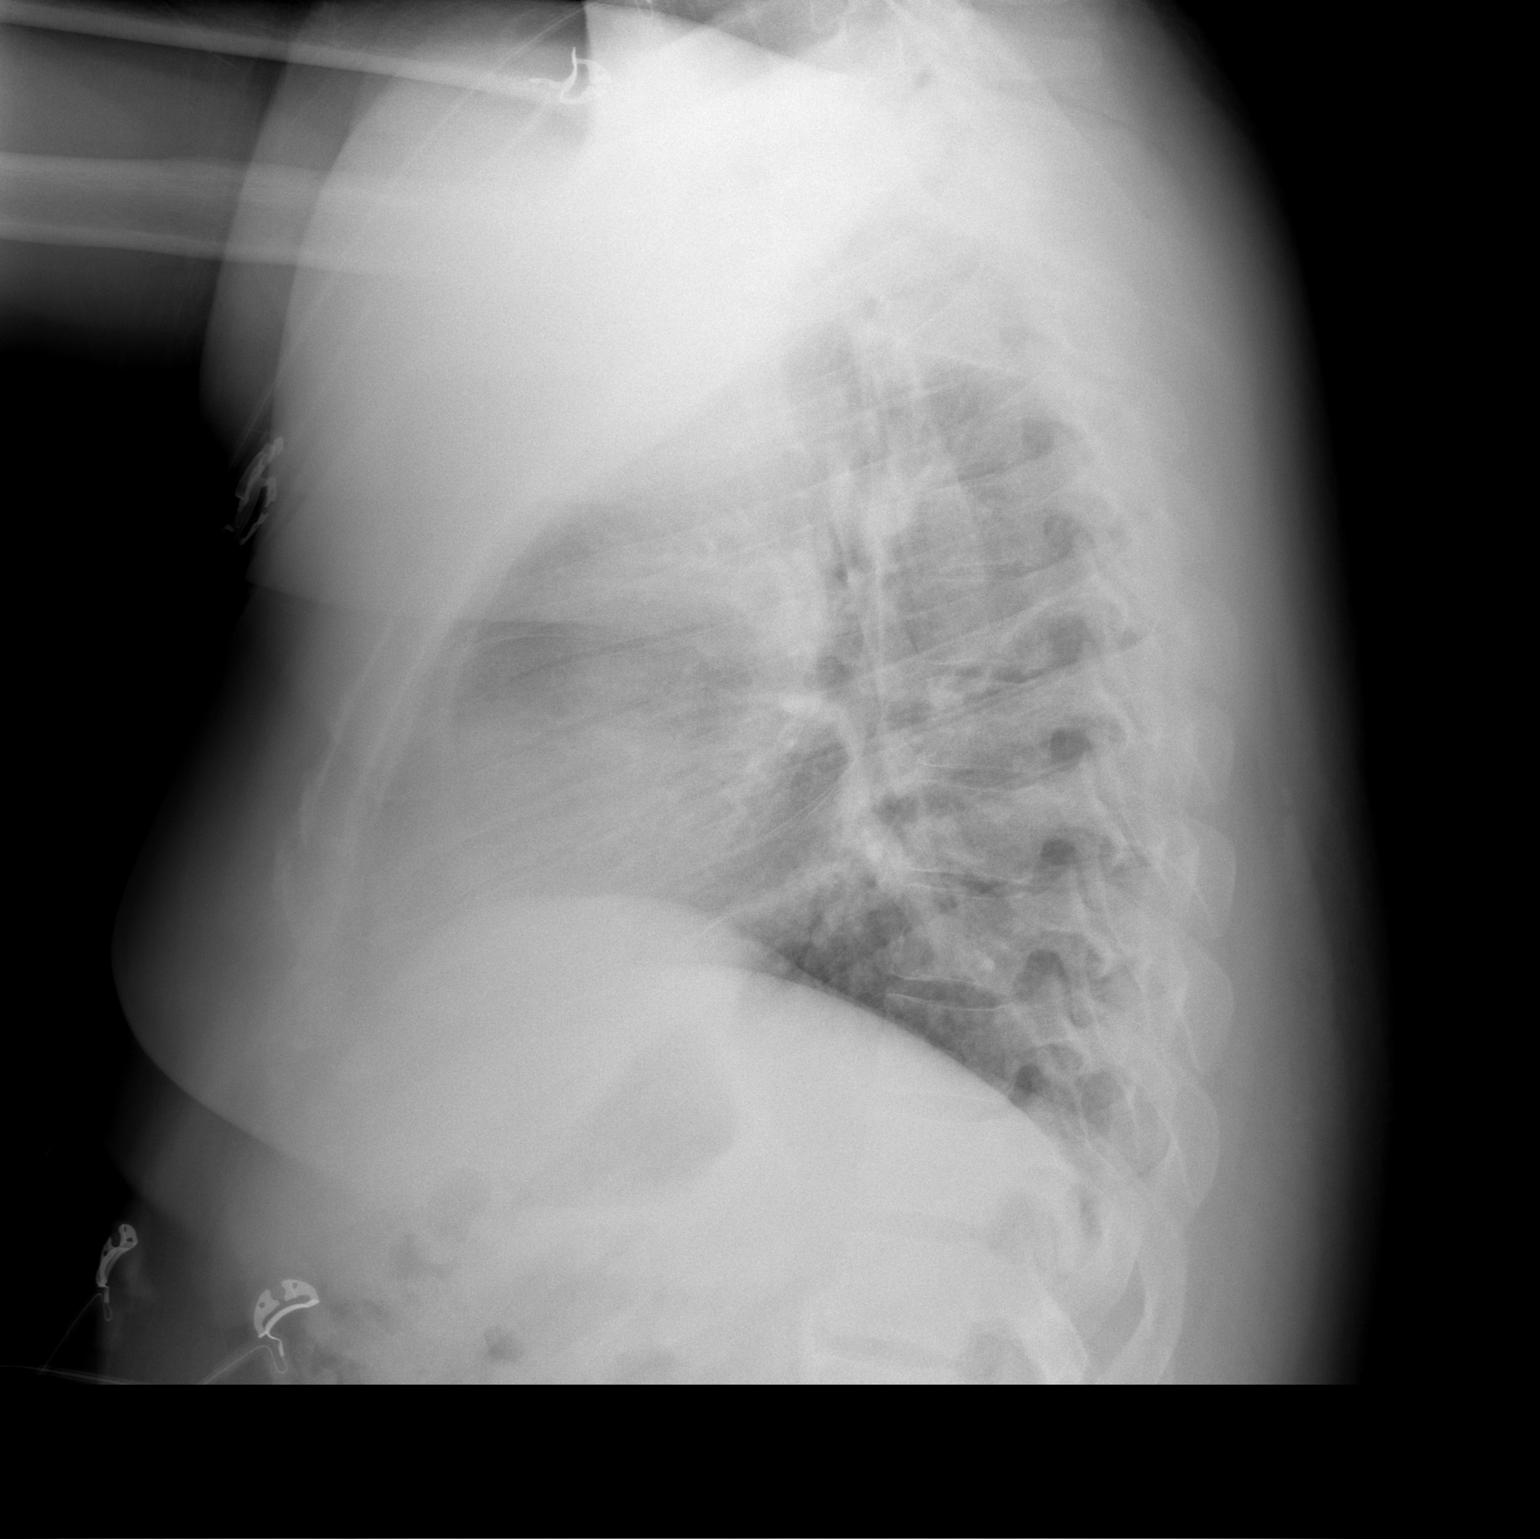

[2 of 2 positions shown; findings below may reference images not displayed]

FINDINGS: Lungs are clear. The heart size and pulmonary vascularity are
normal. No adenopathy. No pneumothorax. No evident bone lesions.
IMPRESSION: No edema or consolidation.  No bony lesions evident.

## 2021-07-12 ENCOUNTER — Encounter: Payer: Self-pay | Admitting: *Deleted

## 2022-01-04 ENCOUNTER — Ambulatory Visit (INDEPENDENT_AMBULATORY_CARE_PROVIDER_SITE_OTHER): Payer: BC Managed Care – PPO

## 2022-01-04 ENCOUNTER — Ambulatory Visit
Admission: EM | Admit: 2022-01-04 | Discharge: 2022-01-04 | Disposition: A | Discharge status: Home / Self Care | Payer: BC Managed Care – PPO | Attending: Urgent Care | Admitting: Urgent Care

## 2022-01-04 DIAGNOSIS — M25562 Pain in left knee: Secondary | ICD-10-CM

## 2022-01-04 MED ORDER — NAPROXEN 500 MG PO TABS: 500.0000 mg | ORAL_TABLET | Freq: Two times a day (BID) | ORAL | 0 refills | Status: DC

## 2022-01-04 NOTE — ED Provider Notes (Signed)
[facility]  Note:  This document was prepared using Conservation officer, historic buildings and may include unintentional dictation errors.  MRN: [id number] DOB: 07-12-1980  Subjective:   Benjamin Stone is a 41 y.o. male presenting for 4-day history of acute onset persistent intermittent moderate left knee pain.  No fall, trauma.  Patient feels some slight swelling.  Has a primarily sedentary job.  He is also been bowling, no inciting incident from there.  However, he does plant on his left leg.  No history of gout.  He does have some arthritis in the ankle.  No current facility-administered medications for this encounter. No current outpatient medications on file.   No Known Allergies  Past Medical History:  Diagnosis Date   Alcohol dependence in remission (HCC)    Abstinence starting 2011   Allergic rhinitis    Anxiety 01/29/2012   Arthritis    Headache(784.0) 01/30/2012   Obesity (BMI 30.0-34.9) 03/10/2016   RENAL CALCULUS, URIC ACID, HX OF 07/10/2006   Qualifier: Diagnosis of  By: McDiarmid MD, Tawanna Cooler       Past Surgical History:  Procedure Laterality Date   TONSILLECTOMY     TOOTH EXTRACTION  2015   Feb 17th     Family History  Problem Relation Age of Onset   Bipolar disorder Mother    Psoriasis Mother    Diabetes Mellitus II Mother    Bipolar disorder Sister     Social History   Tobacco Use   Smoking status: Former    Types: Cigarettes    Quit date: 01/29/2005    Years since quitting: 16.9   Smokeless tobacco: Never  Vaping Use   Vaping Use: Never used  Substance Use Topics   Alcohol use: Not Currently    Alcohol/week: 1.0 standard drink of alcohol    Types: 1 Shots of liquor per week   Drug use: No    ROS   Objective:   Vitals: BP (!) 145/92 (BP Location: Left Arm)   Pulse 77   Temp 98.2 F (36.8 C) (Oral)   Resp 16   SpO2 98%   Physical Exam Constitutional:      General: He is not in acute distress.    Appearance:  Normal appearance. He is well-developed and normal weight. He is not ill-appearing, toxic-appearing or diaphoretic.  HENT:     Head: Normocephalic and atraumatic.     Right Ear: External ear normal.     Left Ear: External ear normal.     Nose: Nose normal.     Mouth/Throat:     Pharynx: Oropharynx is clear.  Eyes:     General: No scleral icterus.       Right eye: No discharge.        Left eye: No discharge.     Extraocular Movements: Extraocular movements intact.  Cardiovascular:     Rate and Rhythm: Normal rate.  Pulmonary:     Effort: Pulmonary effort is normal.  Musculoskeletal:     Cervical back: Normal range of motion.     Left knee: Bony tenderness present. No swelling, deformity, effusion, erythema, ecchymosis, lacerations or crepitus. Decreased range of motion. Tenderness (suprapatellar region) present over the medial joint line and lateral joint line. No patellar tendon tenderness. Normal alignment and normal patellar mobility.  Neurological:     Mental Status: He is alert and oriented to person, place, and time.  Psychiatric:        Mood and Affect:  Mood normal.        Behavior: Behavior normal.        Thought Content: Thought content normal.        Judgment: Judgment normal.    Left knee wrapped using 4" Ace wrap.   Assessment and Plan :   PDMP not reviewed this encounter.  1. Acute pain of left knee     X-ray over-read was pending at time of discharge, recommended follow up with only abnormal results. Otherwise will not call for negative over-read. Patient was in agreement.  Recommended RICE method.  Naproxen for pain and inflammation.  I do suspect that his pain is primarily inflammatory secondary to his bowling.  We will update diagnosis based off the results tomorrow.  Counseled patient on potential for adverse effects with medications prescribed/recommended today, ER and return-to-clinic precautions discussed, patient verbalized understanding.    Jaynee Eagles,  [place] 01/04/22 1939

## 2022-01-04 NOTE — ED Triage Notes (Signed)
Pt c/o left knee pain x 3-4 days-denies injury-NAD-slow limping gait

## 2022-01-24 ENCOUNTER — Ambulatory Visit
Admission: EM | Admit: 2022-01-24 | Discharge: 2022-01-24 | Disposition: A | Discharge status: Home / Self Care | Payer: BC Managed Care – PPO | Attending: Urgent Care | Admitting: Urgent Care

## 2022-01-24 DIAGNOSIS — M75102 Unspecified rotator cuff tear or rupture of left shoulder, not specified as traumatic: Secondary | ICD-10-CM | POA: Diagnosis not present

## 2022-01-24 DIAGNOSIS — S5012XA Contusion of left forearm, initial encounter: Secondary | ICD-10-CM | POA: Diagnosis not present

## 2022-01-24 DIAGNOSIS — M25512 Pain in left shoulder: Secondary | ICD-10-CM

## 2022-01-24 MED ORDER — IBUPROFEN 600 MG PO TABS: 600.0000 mg | ORAL_TABLET | Freq: Four times a day (QID) | ORAL | 0 refills | Status: DC | PRN

## 2022-01-24 MED ORDER — TIZANIDINE HCL 4 MG PO TABS: 4.0000 mg | ORAL_TABLET | Freq: Every day | ORAL | 0 refills | Status: DC

## 2022-01-24 NOTE — ED Triage Notes (Signed)
Pt states he fell 12/12 and hit left forearm on door frame-pt also c/o left shoulder pain x 1 year-NAD-steady gait

## 2022-01-24 NOTE — ED Provider Notes (Signed)
[facility]  Note:  This document was prepared using Conservation officer, historic buildings and may include unintentional dictation errors.  MRN: [id number] DOB: 02/03/1981  Subjective:   Benjamin Stone is a 41 y.o. male presenting for 2 chief complaints.  Left forearm - suffered an injury to left forearm by accidentally falling into a door frame and making direct impact into dorsal-lateral aspect of his forearm 1 week ago. Had bruising and swelling which has improved. However, still has pain and feels a firm knot.   Left shoulder - has had 1 year history of persistent intermittent sharp left shoulder pain with specifics movements. Suffered the initial injury from working out and has rested since but has not completely resolved.   No current facility-administered medications for this encounter.  Current Outpatient Medications:    naproxen (NAPROSYN) 500 MG tablet, Take 1 tablet (500 mg total) by mouth 2 (two) times daily with a meal., Disp: 30 tablet, Rfl: 0   No Known Allergies  Past Medical History:  Diagnosis Date   Alcohol dependence in remission (HCC)    Abstinence starting 2011   Allergic rhinitis    Anxiety 01/29/2012   Arthritis    Headache(784.0) 01/30/2012   Obesity (BMI 30.0-34.9) 03/10/2016   RENAL CALCULUS, URIC ACID, HX OF 07/10/2006   Qualifier: Diagnosis of  By: McDiarmid MD, Tawanna Cooler       Past Surgical History:  Procedure Laterality Date   TONSILLECTOMY     TOOTH EXTRACTION  2015   Feb 17th     Family History  Problem Relation Age of Onset   Bipolar disorder Mother    Psoriasis Mother    Diabetes Mellitus II Mother    Bipolar disorder Sister     Social History   Tobacco Use   Smoking status: Former    Types: Cigarettes    Quit date: 01/29/2005    Years since quitting: 16.9   Smokeless tobacco: Never  Vaping Use   Vaping Use: Never used  Substance Use Topics   Alcohol use: Not Currently    Alcohol/week: 1.0 standard drink of  alcohol    Types: 1 Shots of liquor per week   Drug use: No    ROS   Objective:   Vitals: BP (!) 148/88 (BP Location: Right Arm)   Pulse 78   Temp 98.5 F (36.9 C) (Oral)   Resp 16   SpO2 98%   Physical Exam Constitutional:      General: He is not in acute distress.    Appearance: Normal appearance. He is well-developed and normal weight. He is not ill-appearing, toxic-appearing or diaphoretic.  HENT:     Head: Normocephalic and atraumatic.     Right Ear: External ear normal.     Left Ear: External ear normal.     Nose: Nose normal.     Mouth/Throat:     Pharynx: Oropharynx is clear.  Eyes:     General: No scleral icterus.       Right eye: No discharge.        Left eye: No discharge.     Extraocular Movements: Extraocular movements intact.  Cardiovascular:     Rate and Rhythm: Normal rate.  Pulmonary:     Effort: Pulmonary effort is normal.  Musculoskeletal:     Left shoulder: Tenderness (+Neer test) present. No swelling, deformity, effusion, laceration, bony tenderness or crepitus. Normal range of motion. Normal strength.     Left elbow: No swelling, deformity,  effusion or lacerations. Normal range of motion. No tenderness.     Left forearm: Swelling and tenderness (mild) present. No edema, deformity, lacerations or bony tenderness.     Left wrist: No swelling, deformity, effusion, lacerations, tenderness, bony tenderness, snuff box tenderness or crepitus. Normal range of motion. Normal pulse.       Arms:     Cervical back: Normal range of motion.  Neurological:     Mental Status: He is alert and oriented to person, place, and time.  Psychiatric:        Mood and Affect: Mood normal.        Behavior: Behavior normal.        Thought Content: Thought content normal.        Judgment: Judgment normal.     Assessment and Plan :   PDMP not reviewed this encounter.  1. Contusion of left forearm, initial encounter   2. Acute pain of left shoulder   3. Rotator  cuff syndrome, left     Suspect resolving muscular contusion. However discussed possible muscular versus tendon rupture given physical exam and recommended further evaluation and possible imaging with an orthopedist. No suspicion for fracture, compartment syndrome. Recommended NSAID, muscle relaxant. For the shoulder, suspect rotator cuff syndrome. Discussed possibility of labral injury, rotator cuff tear and again may benefit from imaging as above. Referral placed. Also recommended [facility]. Counseled patient on potential for adverse effects with medications prescribed/recommended today, ER and return-to-clinic precautions discussed, patient verbalized understanding.    Wallis Bamberg, [place] 01/25/22 [phone]

## 2022-02-09 ENCOUNTER — Encounter: Payer: Self-pay | Admitting: Orthopaedic Surgery

## 2022-02-09 ENCOUNTER — Ambulatory Visit (INDEPENDENT_AMBULATORY_CARE_PROVIDER_SITE_OTHER): Payer: BC Managed Care – PPO | Admitting: Orthopaedic Surgery

## 2022-02-09 ENCOUNTER — Ambulatory Visit (INDEPENDENT_AMBULATORY_CARE_PROVIDER_SITE_OTHER): Payer: BC Managed Care – PPO

## 2022-02-09 DIAGNOSIS — G8929 Other chronic pain: Secondary | ICD-10-CM

## 2022-02-09 DIAGNOSIS — M79632 Pain in left forearm: Secondary | ICD-10-CM

## 2022-02-09 DIAGNOSIS — M25512 Pain in left shoulder: Secondary | ICD-10-CM

## 2022-02-09 NOTE — Progress Notes (Signed)
Office Visit Note   Patient: Benjamin Stone           Date of Birth: 1980-05-07           MRN: [id number] Visit Date: 02/09/2022              Requested by: Jaynee Eagles, PA-C [address] [place],  [place] [address] PCP: McDiarmid, Blane Ohara, MD   Assessment & Plan: Visit Diagnoses:  1. Chronic left shoulder pain   2. Left forearm pain     Plan: Impression is traumatic left rotator cuff supraspinatus tear.  Will need MR arthrogram to fully evaluate shoulder for structural abnormalities and to rule out labral pathology.  Follow-Up Instructions: No follow-ups on file.   Orders:  Orders Placed This Encounter  Procedures   XR Shoulder Left   XR Forearm Left   MR Shoulder Left w/ contrast   Arthrogram   No orders of the defined types were placed in this encounter.     Procedures: No procedures performed   Clinical Data: No additional findings.   Subjective: Chief Complaint  Patient presents with   Left Shoulder - Pain   Left Forearm - Injury    HPI [name] is a 42 year old gentleman here for chronic left shoulder pain.  He had an injury at the gym about a year and a half ago and felt immediate pain and weakness.  He has been trying to treat this on his own.  Also saw urgent care provider a couple months ago for this.  This has not gotten better.  Continues to have pain on the left side of the shoulder and pain wakes him up at night.  Right-hand-dominant. Review of Systems  Constitutional: Negative.   HENT: Negative.    Eyes: Negative.   Respiratory: Negative.    Cardiovascular: Negative.   Gastrointestinal: Negative.   Endocrine: Negative.   Genitourinary: Negative.   Skin: Negative.   Allergic/Immunologic: Negative.   Neurological: Negative.   Hematological: Negative.   Psychiatric/Behavioral: Negative.    All other systems reviewed and are negative.    Objective: Vital Signs: There were no vitals taken for this visit.  Physical Exam Vitals  and nursing note reviewed.  Constitutional:      Appearance: He is well-developed.  HENT:     Head: Normocephalic and atraumatic.  Eyes:     Pupils: Pupils are equal, round, and reactive to light.  Pulmonary:     Effort: Pulmonary effort is normal.  Abdominal:     Palpations: Abdomen is soft.  Musculoskeletal:        General: Normal range of motion.     Cervical back: Neck supple.  Skin:    General: Skin is warm.  Neurological:     Mental Status: He is alert and oriented to person, place, and time.  Psychiatric:        Behavior: Behavior normal.        Thought Content: Thought content normal.        Judgment: Judgment normal.     Ortho Exam Examination left shoulder shows pain and weakness with many muscle testing of the supraspinatus.  He is compensating with a shrug.  His passive range of motion is normal.  Remaining portions of the shoulder exam are unremarkable. Specialty Comments:  No specialty comments available.  Imaging: XR Shoulder Left  Result Date: 02/09/2022 Type II acromion.  No acute or structural abnormalities.  XR Forearm Left  Result Date: 02/09/2022 No  acute or structural abnormalities    PMFS History: Patient Active Problem List   Diagnosis Date Noted   bilateral ankle discomfort 03/10/2016   Pure hypercholesterolemia 03/10/2016   Obesity (BMI 30.0-34.9) 03/10/2016   Seborrhea 03/10/2016   Past Medical History:  Diagnosis Date   Alcohol dependence in remission ([facility])    Abstinence starting 2011   Allergic rhinitis    Anxiety 01/29/2012   Arthritis    Headache(784.0) 01/30/2012   Obesity (BMI 30.0-34.9) 03/10/2016   RENAL CALCULUS, URIC ACID, HX OF 07/10/2006   Qualifier: Diagnosis of  By: McDiarmid MD, Sherren Mocha      Family History  Problem Relation Age of Onset   Bipolar disorder Mother    Psoriasis Mother    Diabetes Mellitus II Mother    Bipolar disorder Sister     Past Surgical History:  Procedure Laterality Date   TONSILLECTOMY      TOOTH EXTRACTION  2015   Feb 17th    Social History   Occupational History   Occupation: Nurse, mental health    Comment: [employer]   Occupation: Lobbyist: [employer]    Comment: [employer]  Tobacco Use   Smoking status: Former    Types: Cigarettes    Quit date: 01/29/2005    Years since quitting: 17.0   Smokeless tobacco: Never  Vaping Use   Vaping Use: Never used  Substance and Sexual Activity   Alcohol use: Not Currently    Alcohol/week: 1.0 standard drink of alcohol    Types: 1 Shots of liquor per week   Drug use: No   Sexual activity: Not on file    Comment: Heterosexual

## 2022-02-28 ENCOUNTER — Inpatient Hospital Stay: Admission: RE | Admit: 2022-02-28 | Payer: BC Managed Care – PPO | Source: Ambulatory Visit

## 2022-02-28 ENCOUNTER — Other Ambulatory Visit: Payer: BC Managed Care – PPO

## 2022-03-08 ENCOUNTER — Ambulatory Visit: Payer: BC Managed Care – PPO | Admitting: Orthopaedic Surgery

## 2022-03-20 ENCOUNTER — Ambulatory Visit
Admission: RE | Admit: 2022-03-20 | Discharge: 2022-03-20 | Disposition: A | Discharge status: Home / Self Care | Payer: BC Managed Care – PPO | Source: Ambulatory Visit | Attending: Orthopaedic Surgery | Admitting: Orthopaedic Surgery

## 2022-03-20 DIAGNOSIS — G8929 Other chronic pain: Secondary | ICD-10-CM

## 2022-03-20 MED ORDER — IOPAMIDOL (ISOVUE-M 200) INJECTION 41%
15.0000 mL | Freq: Once | INTRAMUSCULAR | Status: AC
  Administered 2022-03-20: 15 mL via INTRA_ARTICULAR

## 2022-03-21 ENCOUNTER — Ambulatory Visit (INDEPENDENT_AMBULATORY_CARE_PROVIDER_SITE_OTHER): Payer: BC Managed Care – PPO | Admitting: Orthopaedic Surgery

## 2022-03-21 DIAGNOSIS — M25512 Pain in left shoulder: Secondary | ICD-10-CM | POA: Diagnosis not present

## 2022-03-21 DIAGNOSIS — G8929 Other chronic pain: Secondary | ICD-10-CM | POA: Diagnosis not present

## 2022-03-21 NOTE — Progress Notes (Signed)
Office Visit Note   Patient: Benjamin Stone           Date of Birth: 07/06/1980           MRN: [id number] Visit Date: 03/21/2022              Requested by: McDiarmid, Blane Ohara, MD [address] [place],  [place] [address] PCP: McDiarmid, Blane Ohara, MD   Assessment & Plan: Visit Diagnoses:  1. Chronic left shoulder pain     Plan: MRI is negative for structural abnormalities.  This was discussed with the patient.  He may advance activity as tolerated.  Questions encouraged and answered.  Follow-Up Instructions: No follow-ups on file.   Orders:  No orders of the defined types were placed in this encounter.  No orders of the defined types were placed in this encounter.     Procedures: No procedures performed   Clinical Data: No additional findings.   Subjective: No chief complaint on file.   HPI Patient returns today to discuss MRI results. Pain when laying on it.  Pain with certain motions of the arm but overall shoulder is functional.  Review of Systems   Objective: Vital Signs: There were no vitals taken for this visit.  Physical Exam  Ortho Exam Examination shows full shoulder range of motion without significant pain.  He has good strength to manual muscle testing the rotator cuff.  No significant pain with O'Brien's.  Specialty Comments:  No specialty comments available.  Imaging: Arthrogram  Result Date: 03/20/2022 CLINICAL DATA:  Left shoulder pain. FLUOROSCOPY TIME:  Radiation Exposure Index (as provided by the fluoroscopic device): 0.5 mGy Kerma PROCEDURE: The risks and benefits of the procedure were discussed with the patient, and written informed consent was obtained. The patient stated no history of allergy to contrast media. A formal timeout procedure was performed with the patient according to departmental protocol. The patient was placed supine on the fluoroscopy table and the left glenohumeral joint was identified under fluoroscopy. The skin  overlying the left glenohumeral joint was subsequently cleaned with Betadine and a sterile drape was placed over the area of interest. 2 ml 1% Lidocaine was used to anesthetize the skin around the needle insertion site. A 22 gauge spinal needle was inserted into the left glenohumeral joint under fluoroscopy. 12 ml of gadolinium mixture (0.1 ml of Multihance mixed with 15 ml of Isovue-M 200 contrast and 5 ml of sterile saline) were injected into the left glenohumeral joint. The needle was removed and hemostasis was achieved. The patient was subsequently transferred to MRI for imaging. IMPRESSION: Technically successful left shoulder injection for MRI. Electronically Signed   By: Titus Dubin M.D.   On: 03/20/2022 16:26     PMFS History: Patient Active Problem List   Diagnosis Date Noted   bilateral ankle discomfort 03/10/2016   Pure hypercholesterolemia 03/10/2016   Obesity (BMI 30.0-34.9) 03/10/2016   Seborrhea 03/10/2016   Past Medical History:  Diagnosis Date   Alcohol dependence in remission ([facility])    Abstinence starting 2011   Allergic rhinitis    Anxiety 01/29/2012   Arthritis    Headache(784.0) 01/30/2012   Obesity (BMI 30.0-34.9) 03/10/2016   RENAL CALCULUS, URIC ACID, HX OF 07/10/2006   Qualifier: Diagnosis of  By: McDiarmid MD, Sherren Mocha      Family History  Problem Relation Age of Onset   Bipolar disorder Mother    Psoriasis Mother    Diabetes Mellitus II Mother  Bipolar disorder Sister     Past Surgical History:  Procedure Laterality Date   TONSILLECTOMY     TOOTH EXTRACTION  2015   Feb 17th    Social History   Occupational History   Occupation: Nurse, mental health    Comment: [employer]   Occupation: Lobbyist: [employer]    Comment: [employer]  Tobacco Use   Smoking status: Former    Types: Cigarettes    Quit date: 01/29/2005    Years since quitting: 17.1   Smokeless tobacco: Never  Vaping Use   Vaping Use: Never used  Substance and Sexual  Activity   Alcohol use: Not Currently    Alcohol/week: 1.0 standard drink of alcohol    Types: 1 Shots of liquor per week   Drug use: No   Sexual activity: Not on file    Comment: Heterosexual

## 2023-04-09 ENCOUNTER — Encounter: Payer: Self-pay | Admitting: Student

## 2023-04-09 ENCOUNTER — Ambulatory Visit (INDEPENDENT_AMBULATORY_CARE_PROVIDER_SITE_OTHER): Payer: BC Managed Care – PPO | Admitting: Student

## 2023-04-09 VITALS — BP 120/80 | HR 98 | Ht 71.5 in | Wt 270.0 lb

## 2023-04-09 DIAGNOSIS — Z131 Encounter for screening for diabetes mellitus: Secondary | ICD-10-CM

## 2023-04-09 DIAGNOSIS — Z13220 Encounter for screening for lipoid disorders: Secondary | ICD-10-CM

## 2023-04-09 DIAGNOSIS — Z114 Encounter for screening for human immunodeficiency virus [HIV]: Secondary | ICD-10-CM

## 2023-04-09 DIAGNOSIS — Z1159 Encounter for screening for other viral diseases: Secondary | ICD-10-CM

## 2023-04-09 DIAGNOSIS — R7303 Prediabetes: Secondary | ICD-10-CM | POA: Insufficient documentation

## 2023-04-09 DIAGNOSIS — B351 Tinea unguium: Secondary | ICD-10-CM | POA: Diagnosis not present

## 2023-04-09 LAB — POCT GLYCOSYLATED HEMOGLOBIN (HGB A1C): Hemoglobin A1C: 6 % — AB (ref 4.0–5.6)

## 2023-04-09 NOTE — Assessment & Plan Note (Signed)
 Obtain CMP, if liver function normal will initiate terbinafine x 12 weeks.  If so, recheck CMP in 4 weeks for LFT monitoring.

## 2023-04-09 NOTE — Progress Notes (Signed)
   Subjective:  Patient ID: Benjamin Stone, male    DOB: 29-Jun-1980, 43 y.o.   MRN: [id number]  CC: New Patient - Reestablishing, seen last in 2018  HPI:  Benjamin Stone is a very pleasant 43 y.o. male who presents today to establish care.  PMHx: Past Medical History:  Diagnosis Date   Alcohol dependence in remission (HCC)    Abstinence starting 2011   Allergic rhinitis    Anxiety 01/29/2012   Arthritis    Headache(784.0) 01/30/2012   Obesity (BMI 30.0-34.9) 03/10/2016   RENAL CALCULUS, URIC ACID, HX OF 07/10/2006   Qualifier: Diagnosis of  By: McDiarmid MD, Todd     Surgical Hx: Past Surgical History:  Procedure Laterality Date   TONSILLECTOMY     TOOTH EXTRACTION  2015   Feb 17th    Family Hx: Family History  Problem Relation Age of Onset   Bipolar disorder Mother    Psoriasis Mother    Diabetes Mellitus II Mother    Kidney cancer Mother    Bipolar disorder Sister    Social Hx: Current Social History  2 mixed drinks per week, former smoker quit in 2006 with approximately 18 pack years, no drug use, sexually active with single partner  Medications:  Smoking status reviewed  ROS: pertinent noted in the HPI   Objective:  BP 120/80   Pulse 98   Ht 5' 11.5" (1.816 m)   Wt 270 lb (122.5 kg)   SpO2 98%   BMI 37.13 kg/m  Vitals and nursing note reviewed  General: NAD, pleasant, able to participate in exam Cardiac: RRR, S1 S2 present. normal heart sounds, no murmurs. Respiratory: CTAB, normal effort, No wheezes, rales or rhonchi Abdomen: Normoactive bowel sounds, non-tender, non-distended, no hepatosplenomegaly Extremities: no edema or cyanosis. Skin: warm and dry, thickened and discolored rated toenails of feet bilaterally consistent with onychomycosis Neuro: alert, no obvious focal deficits Psych: Normal affect and mood  Assessment & Plan:   Assessment & Plan Onychomycosis Obtain CMP, if liver function normal will initiate terbinafine x 12 weeks.  If so,  recheck CMP in 4 weeks for LFT monitoring. Screening for diabetes mellitus (DM) Obtain A1c. Screening for hyperlipidemia Obtain lipid panel, he is nonfasting.  Prior history of hypercholesterolemia.   Return in about 5 weeks (around 05/14/2023) for Onychomycosis lab follow-up. Shelby Mattocks, DO 04/09/2023, 3:05 PM PGY-3, [facility]

## 2023-04-09 NOTE — Patient Instructions (Addendum)
 It was great to see you today! Thank you for choosing [facility] for your primary care.  Today we addressed: Thank you for reestablishing with [place].  We are checking labs today.  In particular I am checking your liver function.  If this is normal, I will send in a medication for toenail fungus.  I would like you to return in 4 weeks after being on that medication to recheck these labs.  If you haven't already, sign up for My Chart to have easy access to your labs results, and communication with your primary care physician. We are checking some labs today. If they are abnormal, I will call you. If they are normal, I will send you a MyChart message (if it is active) or a letter in the mail. If you do not hear about your labs in the next 2 weeks, please call the office. Return in about 5 weeks (around 05/14/2023) for Onychomycosis lab follow-up. Please arrive 15 minutes before your appointment to ensure smooth check in process.  We appreciate your efforts in making this happen.  Thank you for allowing me to participate in your care, Shelby Mattocks, DO 04/09/2023, 2:58 PM PGY-3, [facility]

## 2023-04-10 LAB — COMPREHENSIVE METABOLIC PANEL
ALT: 20 IU/L (ref 0–44)
AST: 22 IU/L (ref 0–40)
Albumin: 4.5 g/dL (ref 4.1–5.1)
Alkaline Phosphatase: 143 IU/L — ABNORMAL HIGH (ref 44–121)
BUN/Creatinine Ratio: 9 (ref 9–20)
BUN: 12 mg/dL (ref 6–24)
Bilirubin Total: 0.3 mg/dL (ref 0.0–1.2)
CO2: 22 mmol/L (ref 20–29)
Calcium: 9.4 mg/dL (ref 8.7–10.2)
Chloride: 106 mmol/L (ref 96–106)
Creatinine, Ser: 1.29 mg/dL — ABNORMAL HIGH (ref 0.76–1.27)
Globulin, Total: 4.1 g/dL (ref 1.5–4.5)
Glucose: 82 mg/dL (ref 70–99)
Potassium: 4.8 mmol/L (ref 3.5–5.2)
Sodium: 144 mmol/L (ref 134–144)
Total Protein: 8.6 g/dL — ABNORMAL HIGH (ref 6.0–8.5)
eGFR: 71 mL/min/{1.73_m2} (ref >59)

## 2023-04-10 LAB — LIPID PANEL
Chol/HDL Ratio: 7.3 ratio — ABNORMAL HIGH (ref 0.0–5.0)
Cholesterol, Total: 226 mg/dL — ABNORMAL HIGH (ref 100–199)
HDL: 31 mg/dL — ABNORMAL LOW (ref >39)
LDL Chol Calc (NIH): 181 mg/dL — ABNORMAL HIGH (ref 0–99)
Triglycerides: 80 mg/dL (ref 0–149)
VLDL Cholesterol Cal: 14 mg/dL (ref 5–40)

## 2023-04-10 LAB — HCV INTERPRETATION

## 2023-04-10 LAB — HCV AB W REFLEX TO QUANT PCR: HCV Ab: NONREACTIVE

## 2023-04-10 LAB — HIV ANTIBODY (ROUTINE TESTING W REFLEX): HIV Screen 4th Generation wRfx: NONREACTIVE

## 2023-04-11 ENCOUNTER — Telehealth: Payer: Self-pay | Admitting: Student

## 2023-04-11 DIAGNOSIS — B351 Tinea unguium: Secondary | ICD-10-CM

## 2023-04-11 MED ORDER — TERBINAFINE HCL 250 MG PO TABS: 250.0000 mg | ORAL_TABLET | Freq: Every day | ORAL | 1 refills | Status: AC

## 2023-04-11 NOTE — Telephone Encounter (Signed)
 Called to discuss lab results.  Cholesterol elevated although correlates with ASCVD risk of 3.7%.  Strongly recommended lifestyle changes (better diet, increased exercise).  Advised he may need to start a statin if this does not improve and will need annual checks.  Otherwise normal results.  I will send in Lamisil for onychomycosis.  Advised recheck of liver function in 4 weeks to which she has already made an appointment next month.  Separately, he asked for antibiotics as his daughter has strep throat and he recently had a fever.  Advised patient that he would need appointment in order for me to prescribe antibiotics for this matter.  He was thankful for help and states he will call us for appointment if he does not continue to improve.

## 2023-05-14 ENCOUNTER — Ambulatory Visit (INDEPENDENT_AMBULATORY_CARE_PROVIDER_SITE_OTHER): Admitting: Student

## 2023-05-14 ENCOUNTER — Ambulatory Visit: Admitting: Student

## 2023-05-14 VITALS — BP 137/75 | HR 77 | Ht 71.0 in | Wt 265.2 lb

## 2023-05-14 DIAGNOSIS — B351 Tinea unguium: Secondary | ICD-10-CM

## 2023-05-14 NOTE — Assessment & Plan Note (Signed)
 Recheck LFTs as he is receiving terbinafine for treatment of onychomycosis.

## 2023-05-14 NOTE — Progress Notes (Signed)
  SUBJECTIVE:   CHIEF COMPLAINT / HPI:   Onychomycosis: He notes he missed a week of the medication because he went on a cruise otherwise no missed dosages. He presents today for lab follow-up.  PERTINENT  PMH / PSH: Prediabetes, hypercholesterolemia, onychomycosis  OBJECTIVE:  BP 137/75   Pulse 77   Ht 5\' 11"  (1.803 m)   Wt 265 lb 3.2 oz (120.3 kg)   SpO2 98%   BMI 36.99 kg/m  General: Well-appearing, NAD  ASSESSMENT/PLAN:   Assessment & Plan Onychomycosis Recheck LFTs as he is receiving terbinafine for treatment of onychomycosis.  Shelby Mattocks, DO 05/14/2023, 2:50 PM PGY-3, [facility]

## 2023-05-15 LAB — COMPREHENSIVE METABOLIC PANEL WITH GFR
ALT: 19 IU/L (ref 0–44)
AST: 19 IU/L (ref 0–40)
Albumin: 4.4 g/dL (ref 4.1–5.1)
Alkaline Phosphatase: 136 IU/L — ABNORMAL HIGH (ref 44–121)
BUN/Creatinine Ratio: 8 — ABNORMAL LOW (ref 9–20)
BUN: 10 mg/dL (ref 6–24)
Bilirubin Total: 0.3 mg/dL (ref 0.0–1.2)
CO2: 25 mmol/L (ref 20–29)
Calcium: 9.3 mg/dL (ref 8.7–10.2)
Chloride: 102 mmol/L (ref 96–106)
Creatinine, Ser: 1.3 mg/dL — ABNORMAL HIGH (ref 0.76–1.27)
Globulin, Total: 3.3 g/dL (ref 1.5–4.5)
Glucose: 93 mg/dL (ref 70–99)
Potassium: 4.5 mmol/L (ref 3.5–5.2)
Sodium: 140 mmol/L (ref 134–144)
Total Protein: 7.7 g/dL (ref 6.0–8.5)
eGFR: 70 mL/min/{1.73_m2} (ref >59)

## 2023-05-18 ENCOUNTER — Encounter: Payer: Self-pay | Admitting: Student

## 2023-05-18 DIAGNOSIS — R748 Abnormal levels of other serum enzymes: Secondary | ICD-10-CM | POA: Insufficient documentation

## 2023-07-17 ENCOUNTER — Encounter: Payer: Self-pay | Admitting: *Deleted

## 2023-08-09 ENCOUNTER — Encounter: Payer: Self-pay | Admitting: Family Medicine
